# Patient Record
Sex: Female | Born: 1990 | Race: Black or African American | Hispanic: No | Marital: Single | Smoking: Never smoker
Health system: Southern US, Community
[De-identification: ages and names within clinical notes are randomized; demographics above are authoritative.]

## PROBLEM LIST (undated history)

## (undated) ENCOUNTER — Inpatient Hospital Stay (HOSPITAL_COMMUNITY): Payer: Self-pay

## (undated) DIAGNOSIS — R51 Headache: Secondary | ICD-10-CM

## (undated) DIAGNOSIS — R569 Unspecified convulsions: Secondary | ICD-10-CM

## (undated) DIAGNOSIS — R87619 Unspecified abnormal cytological findings in specimens from cervix uteri: Secondary | ICD-10-CM

## (undated) DIAGNOSIS — IMO0002 Reserved for concepts with insufficient information to code with codable children: Secondary | ICD-10-CM

## (undated) DIAGNOSIS — A549 Gonococcal infection, unspecified: Secondary | ICD-10-CM

## (undated) DIAGNOSIS — J45909 Unspecified asthma, uncomplicated: Secondary | ICD-10-CM

## (undated) HISTORY — DX: Gonococcal infection, unspecified: A54.9

## (undated) HISTORY — DX: Unspecified abnormal cytological findings in specimens from cervix uteri: R87.619

## (undated) HISTORY — DX: Unspecified convulsions: R56.9

## (undated) HISTORY — PX: APPENDECTOMY: SHX54

## (undated) HISTORY — DX: Reserved for concepts with insufficient information to code with codable children: IMO0002

## (undated) HISTORY — DX: Headache: R51

---
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
## 2010-10-28 LAB — PREGNANCY, URINE: Preg Test, Ur: POSITIVE

## 2010-10-28 LAB — CBC
HCT: 34.9 % — ABNORMAL LOW (ref 36.0–49.0)
Platelets: 216 10*3/uL (ref 150–400)
WBC: 9.1 10*3/uL (ref 4.5–13.5)

## 2010-10-28 LAB — DIFFERENTIAL
Lymphocytes Relative: 26 % (ref 24–48)
Lymphs Abs: 2.3 10*3/uL (ref 1.1–4.8)
Neutrophils Relative %: 63 % (ref 43–71)

## 2010-10-28 LAB — HCG, QUANTITATIVE, PREGNANCY: hCG, Beta Chain, Quant, S: 131492 m[IU]/mL — ABNORMAL HIGH (ref <5)

## 2010-10-28 LAB — BASIC METABOLIC PANEL
BUN: 4 mg/dL — ABNORMAL LOW (ref 6–23)
Potassium: 3.6 mEq/L (ref 3.5–5.1)

## 2010-10-28 LAB — URINALYSIS, ROUTINE W REFLEX MICROSCOPIC
Glucose, UA: NEGATIVE mg/dL
Protein, ur: NEGATIVE mg/dL
Specific Gravity, Urine: 1.021 (ref 1.005–1.030)
pH: 6 (ref 5.0–8.0)

## 2012-04-25 ENCOUNTER — Emergency Department (HOSPITAL_COMMUNITY)
Admission: EM | Admit: 2012-04-25 | Discharge: 2012-04-26 | Disposition: A | Discharge status: Home / Self Care | Payer: Self-pay | Attending: Emergency Medicine | Admitting: Emergency Medicine

## 2012-04-25 ENCOUNTER — Emergency Department (HOSPITAL_COMMUNITY): Payer: Self-pay

## 2012-04-25 ENCOUNTER — Encounter (HOSPITAL_COMMUNITY): Payer: Self-pay

## 2012-04-25 DIAGNOSIS — Z3490 Encounter for supervision of normal pregnancy, unspecified, unspecified trimester: Secondary | ICD-10-CM

## 2012-04-25 DIAGNOSIS — Z331 Pregnant state, incidental: Secondary | ICD-10-CM | POA: Insufficient documentation

## 2012-04-25 DIAGNOSIS — O200 Threatened abortion: Secondary | ICD-10-CM | POA: Insufficient documentation

## 2012-04-25 DIAGNOSIS — R109 Unspecified abdominal pain: Secondary | ICD-10-CM | POA: Insufficient documentation

## 2012-04-25 DIAGNOSIS — R10819 Abdominal tenderness, unspecified site: Secondary | ICD-10-CM | POA: Insufficient documentation

## 2012-04-25 HISTORY — DX: Unspecified asthma, uncomplicated: J45.909

## 2012-04-25 LAB — COMPREHENSIVE METABOLIC PANEL
ALT: 11 U/L (ref 0–35)
Albumin: 3.9 g/dL (ref 3.5–5.2)
Alkaline Phosphatase: 61 U/L (ref 39–117)
BUN: 9 mg/dL (ref 6–23)
Chloride: 102 mEq/L (ref 96–112)
Glucose, Bld: 80 mg/dL (ref 70–99)
Potassium: 3.6 mEq/L (ref 3.5–5.1)
Sodium: 134 mEq/L — ABNORMAL LOW (ref 135–145)
Total Bilirubin: 0.4 mg/dL (ref 0.3–1.2)

## 2012-04-25 LAB — URINALYSIS, ROUTINE W REFLEX MICROSCOPIC
Leukocytes, UA: NEGATIVE
Nitrite: NEGATIVE
Specific Gravity, Urine: 1.027 (ref 1.005–1.030)
pH: 6 (ref 5.0–8.0)

## 2012-04-25 LAB — CBC WITH DIFFERENTIAL/PLATELET
Basophils Relative: 0 % (ref 0–1)
Hemoglobin: 11.8 g/dL — ABNORMAL LOW (ref 12.0–15.0)
Lymphs Abs: 2.4 10*3/uL (ref 0.7–4.0)
Monocytes Relative: 8 % (ref 3–12)
Neutro Abs: 5.3 10*3/uL (ref 1.7–7.7)
Neutrophils Relative %: 62 % (ref 43–77)
RBC: 3.91 MIL/uL (ref 3.87–5.11)

## 2012-04-25 LAB — POCT PREGNANCY, URINE: Preg Test, Ur: POSITIVE — AB

## 2012-04-25 LAB — WET PREP, GENITAL: Trich, Wet Prep: NONE SEEN

## 2012-04-25 NOTE — ED Notes (Signed)
Pt presents with NAD- reports generalized abdominal pain- denies pain with urination,vaginal discharge and odor

## 2012-04-25 NOTE — ED Provider Notes (Signed)
History     CSN: [id number]  Arrival date & time 04/25/12  1907   First MD Initiated Contact with Patient 04/25/12 2014      Chief Complaint  Patient presents with  . Abdominal Pain    3 days    (Consider location/radiation/quality/duration/timing/severity/associated sxs/prior treatment) HPI Comments: Laurie [name] 21 y.o. female   The chief complaint is: Patient presents with:   Abdominal Pain - 75 days    21 year old female presents to the ED today with chief complaint of lower abdominal pain. States that pain began approximately 2 days ago. Described as intermittent, and crampy, bilateral lower quadrants. Worse on the left side. Last menstrual period was 03/28/2012. He denies, nausea, vomiting, diarrhea, change in bowel habits, fever, chills, myalgias, arthralgias, chest pain, shortness of breath, headaches. Denies, dysuria, or other urinary symptoms. No vaginal discharge or other vaginal complaints.  Patient is a 21 y.o. female presenting with abdominal pain. The history is provided by the patient and medical records. No language interpreter was used.  Abdominal Pain The primary symptoms of the illness include abdominal pain. The primary symptoms of the illness do not include fever, fatigue, shortness of breath, nausea, vomiting, diarrhea, hematemesis, hematochezia, dysuria, vaginal discharge or vaginal bleeding. The current episode started 2 days ago. The onset of the illness was gradual. The problem has been gradually improving.  The patient states that she believes she is currently pregnant. The patient has not had a change in bowel habit. Symptoms associated with the illness do not include chills, anorexia, diaphoresis, heartburn, constipation, urgency, hematuria, frequency or back pain. Significant associated medical issues do not include PUD, inflammatory bowel disease, diabetes, sickle cell disease, liver disease, substance abuse, diverticulitis or cardiac disease.     Past Medical History  Diagnosis Date  . Asthma     History reviewed. No pertinent past surgical history.  No family history on file.  History  Substance Use Topics  . Smoking status: Never Smoker   . Smokeless tobacco: Not on file  . Alcohol Use: No    OB History    Grav Para Term Preterm Abortions TAB SAB Ect Mult Living                  Review of Systems  Constitutional: Negative for fever, chills, diaphoresis and fatigue.  HENT: Negative for trouble swallowing.   Eyes: Negative for visual disturbance.  Respiratory: Negative for shortness of breath.   Cardiovascular: Negative for chest pain and palpitations.  Gastrointestinal: Positive for abdominal pain. Negative for heartburn, nausea, vomiting, diarrhea, constipation, hematochezia, anorexia and hematemesis.  Genitourinary: Negative for dysuria, urgency, frequency, hematuria, vaginal bleeding, vaginal discharge and vaginal pain.  Musculoskeletal: Negative for myalgias, back pain and arthralgias.  Skin: Negative for rash.  Neurological: Negative for weakness, light-headedness and headaches.  Psychiatric/Behavioral: Negative.   All other systems reviewed and are negative.    Allergies  Review of patient's allergies indicates no known allergies.  Home Medications  No current outpatient prescriptions on file.  BP 117/54  Pulse 73  Temp 98.4 F (36.9 C) (Oral)  Resp 18  SpO2 96%  LMP 03/28/2012  Physical Exam  Nursing note and vitals reviewed. Constitutional: She is oriented to person, place, and time. She appears well-developed and well-nourished. No distress.  HENT:  Head: Normocephalic and atraumatic.  Eyes: Conjunctivae normal are normal. No scleral icterus.  Neck: Normal range of motion.  Cardiovascular: Normal rate, regular rhythm and normal heart sounds.  Exam reveals no gallop  and no friction rub.   No murmur heard. Pulmonary/Chest: Effort normal and breath sounds normal. No respiratory  distress.  Abdominal: Soft. Bowel sounds are normal. She exhibits no distension and no mass. There is Tenderness: mild tenderness to  deep palpation of bilateral lower abdominal quadrant.. There is no guarding.  Neurological: She is alert and oriented to person, place, and time.  Skin: Skin is warm and dry. She is not diaphoretic.  Psychiatric: She has a normal mood and affect. Her behavior is normal. Judgment and thought content normal.    ED Course  Procedures (including critical care time) Results for orders placed during the hospital encounter of 04/25/12  URINALYSIS, ROUTINE W REFLEX MICROSCOPIC      Component Value Range   Color, Urine YELLOW  YELLOW   APPearance CLEAR  CLEAR   Specific Gravity, Urine 1.027  1.005 - 1.030   pH 6.0  5.0 - 8.0   Glucose, UA NEGATIVE  NEGATIVE mg/dL   Hgb urine dipstick NEGATIVE  NEGATIVE   Bilirubin Urine NEGATIVE  NEGATIVE   Ketones, ur 15 (*) NEGATIVE mg/dL   Protein, ur NEGATIVE  NEGATIVE mg/dL   Urobilinogen, UA 2.0 (*) 0.0 - 1.0 mg/dL   Nitrite NEGATIVE  NEGATIVE   Leukocytes, UA NEGATIVE  NEGATIVE  CBC WITH DIFFERENTIAL      Component Value Range   WBC 8.5  4.0 - 10.5 K/uL   RBC 3.91  3.87 - 5.11 MIL/uL   Hemoglobin 11.8 (*) 12.0 - 15.0 g/dL   HCT 47.8 (*) 29.5 - 62.1 %   MCV 90.8  78.0 - 100.0 fL   MCH 30.2  26.0 - 34.0 pg   MCHC 33.2  30.0 - 36.0 g/dL   RDW 30.8  65.7 - 84.6 %   Platelets 281  150 - 400 K/uL   Neutrophils Relative 62  43 - 77 %   Neutro Abs 5.3  1.7 - 7.7 K/uL   Lymphocytes Relative 29  12 - 46 %   Lymphs Abs 2.4  0.7 - 4.0 K/uL   Monocytes Relative 8  3 - 12 %   Monocytes Absolute 0.7  0.1 - 1.0 K/uL   Eosinophils Relative 1  0 - 5 %   Eosinophils Absolute 0.1  0.0 - 0.7 K/uL   Basophils Relative 0  0 - 1 %   Basophils Absolute 0.0  0.0 - 0.1 K/uL  COMPREHENSIVE METABOLIC PANEL      Component Value Range   Sodium 134 (*) 135 - 145 mEq/L   Potassium 3.6  3.5 - 5.1 mEq/L   Chloride 102  96 - 112 mEq/L    CO2 24  19 - 32 mEq/L   Glucose, Bld 80  70 - 99 mg/dL   BUN 9  6 - 23 mg/dL   Creatinine, Ser 9.62  0.50 - 1.10 mg/dL   Calcium 9.2  8.4 - 95.2 mg/dL   Total Protein 7.3  6.0 - 8.3 g/dL   Albumin 3.9  3.5 - 5.2 g/dL   AST 18  0 - 37 U/L   ALT 11  0 - 35 U/L   Alkaline Phosphatase 61  39 - 117 U/L   Total Bilirubin 0.4  0.3 - 1.2 mg/dL   GFR calc non Af Amer >90  >90 mL/min   GFR calc Af Amer >90  >90 mL/min  POCT PREGNANCY, URINE      Component Value Range   Preg Test, Ur POSITIVE (*) NEGATIVE  GC/CHLAMYDIA PROBE AMP, GENITAL      Component Value Range   GC Probe Amp, Genital NEGATIVE  NEGATIVE   Chlamydia, DNA Probe NEGATIVE  NEGATIVE  WET PREP, GENITAL      Component Value Range   Yeast Wet Prep HPF POC NONE SEEN  NONE SEEN   Trich, Wet Prep NONE SEEN  NONE SEEN   Clue Cells Wet Prep HPF POC RARE (*) NONE SEEN   WBC, Wet Prep HPF POC RARE (*) NONE SEEN  ABO/RH      Component Value Range   ABO/RH(D) O POS       US Ob Comp Less 14 Wks  04/25/2012  *RADIOLOGY REPORT*  Clinical Data: Pregnant.  Pelvic pain.  No bleeding.  OBSTETRIC <14 WK [place] AND TRANSVAGINAL OB US  Technique:  Both transabdominal and transvaginal ultrasound examinations were performed for complete evaluation of the gestation as well as the maternal uterus, adnexal regions, and pelvic cul-de-sac.  Transvaginal technique was performed to assess early pregnancy.  Comparison:  07/18/2008  Intrauterine gestational sac:  Visualized/normal in shape. Yolk sac: Visualized Embryo: Not visualized Cardiac Activity: Not visualized Heart Rate:  N/A bpm  MSD: 13.4 mm  six w one dUS EDC: 12/18/2012  Maternal uterus/adnexae: Uterus is normal in size and echogenicity with no masses.  There are no adnexal masses.  Both ovaries are normal in size and echogenicity with normal blood flow by color flow analysis.  No adnexal masses.  No pelvic free fluid.  IMPRESSION: Well-formed gestational sac within the uterus containing a yolk sac, but  no embryo. This may simply reflect an early intrauterine pregnancy, but will need follow up imaging to confirm this.  Uterus is otherwise unremarkable.  Normal ovaries and adnexa.  Recommend follow-up OB ultrasound in 7-10 days to document normal pregnancy progression.   Original Report Authenticated By: Domenic Moras, M.D.      1. Pregnancy   2. Threatened miscarriage in early pregnancy   3. Abdominal pain       MDM  Patient with positive urine pregnancy test. Abdominal pain, but She denies any bleeding, or fluid in the vaginal vault. She is G2P1001. Patient was not using any forms of birth control.  Pelvic exam: VULVA: normal appearing vulva with no masses, tenderness or lesions, VAGINA: normal appearing vagina with normal color and discharge, no lesions, CERVIX: ectropion preset, multiparous os, Nabothian cyst at 10 and 11 o'clock, UTERUS: uterus is normal size, shape, consistency and nontender, enlarged and just palpable above the pelvic ramus, ADNEXA: normal adnexa in size, nontender and no masses, no masses, WET MOUNT done - results: clue cells, white blood cells.  Patient with present gestational and yolk sac.  She will need repeat ultrasound in  7 days.  I have given the patien tfolow up at  [facility] outpatient clinic.  Labs shoe O pos blood type. No need for rhogam, rare clue cells and no vaginal sxs Discussed reasons to seek immediate care. Patient expresses understanding and agrees with plan.          Arthor Captain, PA-C 04/26/12 2152

## 2012-04-25 NOTE — ED Notes (Signed)
US at bedside

## 2012-04-25 NOTE — ED Notes (Signed)
Pt thinks she might be pregnant- pt is sexually active with no form of birth control

## 2012-04-26 ENCOUNTER — Encounter (HOSPITAL_COMMUNITY): Payer: Self-pay

## 2012-04-26 LAB — ABO/RH: ABO/RH(D): O POS

## 2012-04-26 NOTE — ED Provider Notes (Signed)
Medical screening examination/treatment/procedure(s) were performed by non-physician practitioner and as supervising physician I was immediately available for consultation/collaboration.  Hurman Horn, MD 04/26/12 2322

## 2012-05-25 ENCOUNTER — Inpatient Hospital Stay (HOSPITAL_COMMUNITY)
Admission: AD | Admit: 2012-05-25 | Discharge: 2012-05-25 | Disposition: A | Discharge status: Home / Self Care | Payer: Medicaid Other | Source: Ambulatory Visit | Attending: Obstetrics & Gynecology | Admitting: Obstetrics & Gynecology

## 2012-05-25 ENCOUNTER — Encounter (HOSPITAL_COMMUNITY): Payer: Self-pay | Admitting: *Deleted

## 2012-05-25 DIAGNOSIS — M549 Dorsalgia, unspecified: Secondary | ICD-10-CM | POA: Insufficient documentation

## 2012-05-25 DIAGNOSIS — O26899 Other specified pregnancy related conditions, unspecified trimester: Secondary | ICD-10-CM

## 2012-05-25 DIAGNOSIS — N949 Unspecified condition associated with female genital organs and menstrual cycle: Secondary | ICD-10-CM

## 2012-05-25 DIAGNOSIS — O99891 Other specified diseases and conditions complicating pregnancy: Secondary | ICD-10-CM | POA: Insufficient documentation

## 2012-05-25 LAB — URINALYSIS, ROUTINE W REFLEX MICROSCOPIC
Glucose, UA: NEGATIVE mg/dL
Ketones, ur: NEGATIVE mg/dL
Leukocytes, UA: NEGATIVE
Nitrite: NEGATIVE
pH: 6 (ref 5.0–8.0)

## 2012-05-25 NOTE — MAU Note (Signed)
Pt states yesterday had bad cramp when she ate, and has continued to have low back pain. Stomach pain is intermittent, has acid reflux/indigestion. Denies uti s/s. Does note some abd pain post voiding at times. Denies abnormal discharge or bleeding.

## 2012-05-25 NOTE — MAU Note (Signed)
Pt states she has had lower back pain and abdominal pain for about 3 wks.

## 2012-05-25 NOTE — MAU Provider Note (Signed)
  History     CSN: [id number]  Arrival date and time: 05/25/12 1304   First Provider Initiated Contact with Patient 05/25/12 1629      Chief Complaint  Patient presents with  . Back Pain  . Light headed    HPI Pt is a G3P1102 here at 10.3 wks IUP with report of lower pelvic pain that started 4 weeks ago.  Pain is described as "crampy" with no report of vaginal bleeding.  Pain radiates to lower back.  No report of UTI or abnormal vaginal discharge symptoms.    Past Medical History  Diagnosis Date  . Asthma     Past Surgical History  Procedure Date  . No past surgeries     Family History  Problem Relation Age of Onset  . Hypertension Father     History  Substance Use Topics  . Smoking status: Never Smoker   . Smokeless tobacco: Not on file  . Alcohol Use: No    Allergies: No Known Allergies  No prescriptions prior to admission    Review of Systems  Gastrointestinal: Positive for abdominal pain (cramping).  Musculoskeletal: Positive for back pain.  Neurological: Positive for dizziness.  All other systems reviewed and are negative.   Physical Exam   Blood pressure 118/63, pulse 71, temperature 97.5 F (36.4 C), temperature source Oral, resp. rate 16, height 5\' 5"  (1.651 m), weight 52.39 kg (115 lb 8 oz), last menstrual period 03/28/2012, SpO2 100.00%, unknown if currently breastfeeding.  Physical Exam  Constitutional: She is oriented to person, place, and time. She appears well-developed and well-nourished. No distress.  HENT:  Head: Normocephalic.  Neck: Normal range of motion. Neck supple.  Cardiovascular: Normal rate, regular rhythm and normal heart sounds.   Respiratory: Effort normal and breath sounds normal. No respiratory distress.  GI: Soft. There is no tenderness. There is no CVA tenderness.       Fundal height 11cm   Genitourinary: Uterus is enlarged. Cervix exhibits no motion tenderness and no discharge.  Musculoskeletal: Normal range of  motion.  Neurological: She is alert and oriented to person, place, and time.  Skin: Skin is warm and dry.  Psychiatric: She has a normal mood and affect.    MAU Course  Procedures  Results for orders placed during the hospital encounter of 05/25/12 (from the past 24 hour(s))  URINALYSIS, ROUTINE W REFLEX MICROSCOPIC     Status: Abnormal   Collection Time   05/25/12  1:47 PM      Component Value Range   Color, Urine YELLOW  YELLOW   APPearance CLEAR  CLEAR   Specific Gravity, Urine >1.030 (*) 1.005 - 1.030   pH 6.0  5.0 - 8.0   Glucose, UA NEGATIVE  NEGATIVE mg/dL   Hgb urine dipstick NEGATIVE  NEGATIVE   Bilirubin Urine NEGATIVE  NEGATIVE   Ketones, ur NEGATIVE  NEGATIVE mg/dL   Protein, ur NEGATIVE  NEGATIVE mg/dL   Urobilinogen, UA 1.0  0.0 - 1.0 mg/dL   Nitrite NEGATIVE  NEGATIVE   Leukocytes, UA NEGATIVE  NEGATIVE   Bedside ultrasound:  +IUP, +FHR seen 140's  Assessment and Plan  Pelvic Pain in Early Pregnancy  Plan: DC to home Begin prenatal vitamins. Schedule appt with [facility] as planned  [facility] 05/25/2012, 4:32 PM

## 2012-05-25 NOTE — MAU Provider Note (Signed)
Attestation of Attending Supervision of Advanced Practitioner (CNM/NP): Evaluation and management procedures were performed by the Advanced Practitioner under my supervision and collaboration.  I have reviewed the Advanced Practitioner's note and chart, and I agree with the management and plan.  Erricka Falkner, MD, FACOG Attending Obstetrician & Gynecologist Faculty Practice, [facility]  

## 2012-06-07 ENCOUNTER — Inpatient Hospital Stay (HOSPITAL_COMMUNITY)
Admission: AD | Admit: 2012-06-07 | Discharge: 2012-06-07 | Disposition: A | Discharge status: Home / Self Care | Payer: Medicaid Other | Source: Ambulatory Visit | Attending: Obstetrics & Gynecology | Admitting: Obstetrics & Gynecology

## 2012-06-07 ENCOUNTER — Encounter (HOSPITAL_COMMUNITY): Payer: Self-pay | Admitting: *Deleted

## 2012-06-07 DIAGNOSIS — Z3490 Encounter for supervision of normal pregnancy, unspecified, unspecified trimester: Secondary | ICD-10-CM

## 2012-06-07 DIAGNOSIS — K5900 Constipation, unspecified: Secondary | ICD-10-CM | POA: Insufficient documentation

## 2012-06-07 DIAGNOSIS — O99891 Other specified diseases and conditions complicating pregnancy: Secondary | ICD-10-CM | POA: Insufficient documentation

## 2012-06-07 DIAGNOSIS — R109 Unspecified abdominal pain: Secondary | ICD-10-CM | POA: Insufficient documentation

## 2012-06-07 LAB — URINALYSIS, ROUTINE W REFLEX MICROSCOPIC
Bilirubin Urine: NEGATIVE
Ketones, ur: NEGATIVE mg/dL
Leukocytes, UA: NEGATIVE
Nitrite: NEGATIVE
Specific Gravity, Urine: 1.015 (ref 1.005–1.030)
Urobilinogen, UA: 0.2 mg/dL (ref 0.0–1.0)

## 2012-06-07 MED ORDER — FLEET ENEMA 7-19 GM/118ML RE ENEM
1.0000 | ENEMA | Freq: Once | RECTAL | Status: AC
  Administered 2012-06-07: 1 via RECTAL

## 2012-06-07 NOTE — MAU Note (Signed)
PT HAS BEEN TO B-ROOM   WITH GOOD RESULTS-  AND SAYS SHE FEELS   SO MUCH BETTER.

## 2012-06-07 NOTE — MAU Note (Signed)
PT HAS ARRIVED VIA EMS.  COLLECTED URINE.     PT SAYS SHE HAS  BEEN HAVING  LOWER ABD SINCE-  STARTED LAST Sunday.  LAST SEX  WAS YESTERDAY.    HAS HAPPENED  BEFORE- 2.5 MTHS AGO- IN September- WENT TO  [place]- DID  UPT.    HAS NOT STARTED PNC.    HAD OTHER  2 BABIES IN [place].      HAS NOT TAKEN ANY MED FOR PAIN.

## 2012-06-07 NOTE — MAU Provider Note (Signed)
History     CSN: [id number]  Arrival date and time: 06/07/12 5638   First Provider Initiated Contact with Patient 06/07/12 0037      No chief complaint on file.  HPI Laurie [name] is a 21 y.o. female @ [redacted]w[redacted]d gestation who presents to MAU via EMS with abdominal pain. The pain started last week. She describes the pain as sharp. The pain is located in the lower abdomen and radiates to the left upper abdomen. Pain keeps her from sleeping sometimes. She was evaluated for the pregnancy at [facility] in the ED 10/13 and had ultrasound, pelvic exam and cultures. The cultures were negative and the ultrasound showed an IUP. She came to MAU with abdominal pain 05/25/12 and had exam and bedside ultrasound. She returns tonight with the same pain. She has not taken anything for pain. The history was provided by the patient.  OB History    Grav Para Term Preterm Abortions TAB SAB Ect Mult Living   3 2 1 1  0 0 0 0 0 2      Past Medical History  Diagnosis Date  . Asthma     Past Surgical History  Procedure Date  . No past surgeries     Family History  Problem Relation Age of Onset  . Hypertension Father     History  Substance Use Topics  . Smoking status: Former Games developer  . Smokeless tobacco: Not on file  . Alcohol Use: No    Allergies: No Known Allergies  No prescriptions prior to admission    Review of Systems  Constitutional: Negative for fever, chills and weight loss.  HENT: Negative for ear pain, nosebleeds, congestion, sore throat and neck pain.   Eyes: Negative for blurred vision, double vision, photophobia and pain.  Respiratory: Negative for cough, shortness of breath and wheezing.   Cardiovascular: Negative for chest pain, palpitations and leg swelling.  Gastrointestinal: Positive for heartburn and abdominal pain. Negative for nausea, vomiting, diarrhea and constipation.  Genitourinary: Positive for frequency. Negative for dysuria and urgency.  Musculoskeletal:  Positive for back pain. Negative for myalgias.  Skin: Negative for itching and rash.  Neurological: Positive for headaches. Negative for dizziness, sensory change, speech change, seizures and weakness.  Endo/Heme/Allergies: Does not bruise/bleed easily.  Psychiatric/Behavioral: Negative for depression. The patient is not nervous/anxious.    Physical Exam   Blood pressure 100/54, pulse 79, temperature 98.1 F (36.7 C), temperature source Oral, resp. rate 20, height 5\' 6"  (1.676 m), weight 117 lb 6 oz (53.241 kg), last menstrual period 03/28/2012, unknown if currently breastfeeding.  Physical Exam  Nursing note and vitals reviewed. Constitutional: She is oriented to person, place, and time. She appears well-developed and well-nourished. No distress.  HENT:  Head: Normocephalic and atraumatic.  Eyes: EOM are normal.  Neck: Neck supple.  Cardiovascular: Normal rate.   Respiratory: Effort normal.  GI: Soft. There is no tenderness.  Genitourinary:       External genitalia without lesions. White discharge vaginal vault. Cervix long, closed, no CMT, no adnexal tenderness. Uterus consistent with dates.  Rectal exam good tone, hard stool palpated.   Musculoskeletal: Normal range of motion.  Neurological: She is alert and oriented to person, place, and time.  Skin: Skin is warm and dry.  Psychiatric: She has a normal mood and affect. Her behavior is normal. Judgment and thought content normal.   Assessment: 21 y.o. female @ [redacted]w[redacted]d with constipation   Plan:  Fleet enema   Discussed with patient  diet and constipation MAU Course  Procedures Patient states her pain is completely gone after the enema. Instructions regarding prenatal care and diet. Discussed with the patient and all questioned fully answered. She will return if any problems arise.  Lem Peary, RN, FNP, [facility] 06/07/2012, 1:40 AM

## 2012-07-14 NOTE — L&D Delivery Note (Signed)
Delivery Note Labor progressed w/o further intervention following AROM.  Complete and uncomfortable w/ urge to push at 0250. At 3:02 AM a viable female "[name]" was delivered via Vaginal, Spontaneous Delivery (Presentation: Middle Occiput Anterior).  APGAR: 9, 9; weight 6 lb 15.5 oz (3160 g).  Newborn w/ spontaneous cry before body had even fully delivered.  Cord doubly clamped and cut by FOB.  Placenta status: Intact, Spontaneous, Schultz.  Cord: 3 vessels with the following complications: None.  Cord pH: not  Collected.  Anesthesia: Epidural  Episiotomy: None Lacerations: None Suture Repair: n/a Est. Blood Loss (mL): 300  Mom to postpartum.  Baby to nursery-stable. Pt, FOB, and family all bonding well w/ newborn.  Planning outpatient circumcision.  Cleotha Tsang H 12/16/2012, 4:18 AM

## 2012-07-16 ENCOUNTER — Ambulatory Visit (INDEPENDENT_AMBULATORY_CARE_PROVIDER_SITE_OTHER): Payer: Medicaid Other | Admitting: Obstetrics and Gynecology

## 2012-07-16 DIAGNOSIS — J45909 Unspecified asthma, uncomplicated: Secondary | ICD-10-CM

## 2012-07-16 DIAGNOSIS — Z3490 Encounter for supervision of normal pregnancy, unspecified, unspecified trimester: Secondary | ICD-10-CM

## 2012-07-16 DIAGNOSIS — Z331 Pregnant state, incidental: Secondary | ICD-10-CM

## 2012-07-16 DIAGNOSIS — Z3689 Encounter for other specified antenatal screening: Secondary | ICD-10-CM

## 2012-07-16 LAB — POCT URINALYSIS DIPSTICK
Bilirubin, UA: NEGATIVE
Blood, UA: NEGATIVE
Glucose, UA: NEGATIVE
Ketones, UA: NEGATIVE
Nitrite, UA: NEGATIVE
Spec Grav, UA: 1.015

## 2012-07-16 MED ORDER — ALBUTEROL SULFATE HFA 108 (90 BASE) MCG/ACT IN AERS: 2.0000 | INHALATION_SPRAY | Freq: Four times a day (QID) | RESPIRATORY_TRACT | Status: DC | PRN

## 2012-07-16 NOTE — Progress Notes (Signed)
NOB interview.  Pt states she fell down steps and landed on her back 07/11/12. Did not have eval.  States previous to fall felt+FM but none since. No bleeding. Some cramping at night.  Feels as though abd has decreased in size. For eval today with VL.   Per VL, anatomy U/S scheduled 07/19/12 and F/U with TH. Pt desires QUAD screen.  To be done at [facility] if dating correct per VL. Pt requesting inhaler to be used prn for asthma. Rx per VL. Advised pt to F/U with PCP.

## 2012-07-16 NOTE — Progress Notes (Signed)
FHT done due to recent fall and pt concerns re less perceptions of FM. FHT 150, Fundus at U. Anticipate anatomy scan in 1 to 2 weeks. NOB workup 1/14

## 2012-07-17 LAB — CULTURE, OB URINE

## 2012-07-19 ENCOUNTER — Ambulatory Visit (INDEPENDENT_AMBULATORY_CARE_PROVIDER_SITE_OTHER): Payer: Medicaid Other

## 2012-07-19 ENCOUNTER — Ambulatory Visit (INDEPENDENT_AMBULATORY_CARE_PROVIDER_SITE_OTHER): Payer: Medicaid Other | Admitting: *Deleted

## 2012-07-19 VITALS — BP 92/58 | Wt 122.0 lb

## 2012-07-19 DIAGNOSIS — O289 Unspecified abnormal findings on antenatal screening of mother: Secondary | ICD-10-CM

## 2012-07-19 DIAGNOSIS — Z3689 Encounter for other specified antenatal screening: Secondary | ICD-10-CM

## 2012-07-19 DIAGNOSIS — Z331 Pregnant state, incidental: Secondary | ICD-10-CM

## 2012-07-19 DIAGNOSIS — O358XX0 Maternal care for other (suspected) fetal abnormality and damage, not applicable or unspecified: Secondary | ICD-10-CM

## 2012-07-19 LAB — PRENATAL PANEL VII
Basophils Absolute: 0 10*3/uL (ref 0.0–0.1)
Basophils Relative: 0 % (ref 0–1)
Eosinophils Absolute: 0.1 10*3/uL (ref 0.0–0.7)
Hepatitis B Surface Ag: NEGATIVE
MCH: 31.1 pg (ref 26.0–34.0)
MCHC: 33 g/dL (ref 30.0–36.0)
Neutro Abs: 5.6 10*3/uL (ref 1.7–7.7)
Neutrophils Relative %: 68 % (ref 43–77)
Platelets: 236 10*3/uL (ref 150–400)
RBC: 3.86 MIL/uL — ABNORMAL LOW (ref 3.87–5.11)

## 2012-07-19 LAB — US OB COMP + 14 WK

## 2012-07-19 NOTE — Progress Notes (Signed)
Fundal hght U-2  Doing well with no c/o Reviewed [place] results - bilat pyelectasis noted will f/up with [place] at approx 28wks Has NOB appt on 1/13 Quad screen done today.

## 2012-07-19 NOTE — Progress Notes (Signed)
[redacted]w[redacted]d  AFP Quad today.  Pt states when she got up this morning she was light-headed.   Ultrasound shows:  Gestational age by [place] : 18w 0d     SIUP  S=D     [place] EDD: 12/20/2012             EFW: 221g (8oz)  30.3% tile            Cervical length: 3.09 cm            Placenta localization: anterior           Fetal presentation: breech       Anatomy survey is see comments    Anatomy complete?yes            Gender : female        Comments: Placental edge is 4.8 cm from internal os-normal fluids normal     (Vertical pocket = 4.8cm)    No Anomalies seen (profile, palate, philtrum, nasal bone, open hands, 5th digit, feet heel seen,    Female gender, ductal arch is unseen due to fetal position.    Bilateral Pyelectasis (left kidney = 0.37 cm , right kidney = 0.47cm)    Hypocoiled  Umbilical cord is noted. Suggest f/u in 4 weeks.    Normal ovaries, No fluid in CDS , normal adenexas

## 2012-07-20 LAB — HEMOGLOBINOPATHY EVALUATION
Hemoglobin Other: 0 %
Hgb A2 Quant: 2.8 % (ref 2.2–3.2)
Hgb A: 97.2 % (ref 96.8–97.8)
Hgb F Quant: 0 % (ref 0.0–2.0)
Hgb S Quant: 0 %

## 2012-07-20 LAB — AFP, QUAD SCREEN
Curr Gest Age: 18 wks.days
INH: 58 pg/mL
MoM for INH: 0.3
Osb Risk: 1:54400 {titer}
Trisomy 18 (Edward) Syndrome Interp.: 1:9900 {titer}
uE3 Value: 0.9 ng/mL

## 2012-07-26 ENCOUNTER — Ambulatory Visit: Payer: Medicaid Other | Admitting: *Deleted

## 2012-07-26 VITALS — BP 98/52 | Wt 123.0 lb

## 2012-07-26 DIAGNOSIS — O09219 Supervision of pregnancy with history of pre-term labor, unspecified trimester: Secondary | ICD-10-CM

## 2012-07-26 DIAGNOSIS — Z331 Pregnant state, incidental: Secondary | ICD-10-CM

## 2012-07-26 LAB — POCT WET PREP (WET MOUNT)
Clue Cells Wet Prep Whiff POC: NEGATIVE
pH: 4.5

## 2012-07-26 MED ORDER — PRENATAL MULTIVITAMIN CH: 1.0000 | ORAL_TABLET | Freq: Every day | ORAL | Status: DC

## 2012-07-26 NOTE — Progress Notes (Unsigned)
Pt is here today for her NOB work-up . Pt stated she had a pap 2012 ?Marland Kitchen Pt stated no other issues today.

## 2012-07-26 NOTE — Progress Notes (Unsigned)
Subjective:    Laurie [name] is being seen today for her first obstetrical visit at [redacted]w[redacted]d gestation by U/S She is 22 y.o. African American. The father is involved in the pregnancy.  She does not work outside the home.  She denies any vag bleeding, cramping, or discharge.  She denies nausea/vomiting.   Her obstetrical history is significant for: Patient Active Problem List  Diagnosis  . Asthma  . Pyelectasis of fetus on prenatal ultrasound      Pregnancy history fully reviewed.  The following portions of the patient's history were reviewed and updated as appropriate: allergies, current medications, past family history, past medical history, past social history, past surgical history and problem list.  Review of Systems Pertinent ROS is described in HPI   Objective:   BP 98/52  Wt 123 lb (55.792 kg)  LMP 03/28/2012  Breastfeeding? Unknown Wt Readings from Last 1 Encounters:  07/26/12 123 lb (55.792 kg)   BMI: 18.6 General: alert, cooperative and no distress HEENT: grossly normal  Thyroid: normal  Respiratory: clear to auscultation bilaterally Cardiovascular: regular rate and rhythm,  Breasts:  No dominant masses, nipples erect Gastrointestinal: soft, non-tender; no masses,  no organomegaly Extremities: extremities normal, no pain or edema   EXTERNAL GENITALIA: normal appearing vulva with no masses, tenderness or lesions VAGINA: no abnormal discharge, bleeding or lesions CERVIX: no lesions or cervical motion tenderness; cervix closed, long, firm UTERUS: gravid and consistent with 19 weeks ADNEXA: no masses palpable and nontender OB EXAM PELVIMETRY: appears adequate  FHR:  140  bpm    Assessment:    Pregnancy at   Plan:     Prenatal labs rv'd  Urine cx: WNL Blood type O+  Pap smear collected:  yes GC/Chlamydia collected:  yes Wet prep:  neg x3  Discussion of Genetic testing options:Discussed Problem list reviewed and updated. rv'd how and  when to call for emergencies rv'd practice routines Discussed nutrition and exercise and common pregnancy discomforts   F/u 4 Weeks   Alexsa Flaum RN/ VL

## 2012-07-28 LAB — PAP IG, CT-NG, RFX HPV ASCU

## 2012-08-02 NOTE — Progress Notes (Signed)
Quick Note:  Please send ASCUS letter. Pap in 1 year. ______ 

## 2012-08-03 ENCOUNTER — Encounter: Payer: Self-pay | Admitting: Obstetrics and Gynecology

## 2012-08-10 ENCOUNTER — Telehealth: Payer: Self-pay | Admitting: *Deleted

## 2012-08-10 NOTE — Telephone Encounter (Signed)
Tc to pt per telephone call. Informed pt to try Tums. If no improvement, pt to try otc Zantac, Pepcid, or Prilosec. Told to cb if no improvement. Pt aware PNV's was called to [facility] ([address]). Pt agrees.

## 2012-08-12 ENCOUNTER — Telehealth: Payer: Self-pay | Admitting: *Deleted

## 2012-08-12 DIAGNOSIS — Z331 Pregnant state, incidental: Secondary | ICD-10-CM

## 2012-08-12 MED ORDER — PRENATAL MULTIVITAMIN CH: 1.0000 | ORAL_TABLET | Freq: Every day | ORAL | Status: DC

## 2012-08-12 NOTE — Telephone Encounter (Signed)
Lm on vm rx sent to pharm 

## 2012-08-26 ENCOUNTER — Encounter: Payer: Medicaid Other | Admitting: Obstetrics and Gynecology

## 2012-09-03 ENCOUNTER — Encounter: Payer: Medicaid Other | Admitting: Family Medicine

## 2012-09-05 ENCOUNTER — Encounter (HOSPITAL_COMMUNITY): Payer: Self-pay | Admitting: Obstetrics and Gynecology

## 2012-09-05 ENCOUNTER — Inpatient Hospital Stay (HOSPITAL_COMMUNITY)
Admission: AD | Admit: 2012-09-05 | Discharge: 2012-09-05 | Disposition: A | Discharge status: Home / Self Care | Payer: Medicaid Other | Source: Ambulatory Visit | Attending: Obstetrics and Gynecology | Admitting: Obstetrics and Gynecology

## 2012-09-05 DIAGNOSIS — N949 Unspecified condition associated with female genital organs and menstrual cycle: Secondary | ICD-10-CM | POA: Insufficient documentation

## 2012-09-05 DIAGNOSIS — N760 Acute vaginitis: Secondary | ICD-10-CM | POA: Insufficient documentation

## 2012-09-05 DIAGNOSIS — B9689 Other specified bacterial agents as the cause of diseases classified elsewhere: Secondary | ICD-10-CM | POA: Insufficient documentation

## 2012-09-05 DIAGNOSIS — O2390 Unspecified genitourinary tract infection in pregnancy, unspecified trimester: Secondary | ICD-10-CM | POA: Insufficient documentation

## 2012-09-05 DIAGNOSIS — R109 Unspecified abdominal pain: Secondary | ICD-10-CM | POA: Insufficient documentation

## 2012-09-05 DIAGNOSIS — M545 Low back pain, unspecified: Secondary | ICD-10-CM | POA: Insufficient documentation

## 2012-09-05 LAB — WET PREP, GENITAL: Trich, Wet Prep: NONE SEEN

## 2012-09-05 LAB — URINALYSIS, ROUTINE W REFLEX MICROSCOPIC
Glucose, UA: NEGATIVE mg/dL
Hgb urine dipstick: NEGATIVE
Specific Gravity, Urine: 1.02 (ref 1.005–1.030)
pH: 7 (ref 5.0–8.0)

## 2012-09-05 LAB — URINE MICROSCOPIC-ADD ON

## 2012-09-05 MED ORDER — METRONIDAZOLE 500 MG PO TABS: 500.0000 mg | ORAL_TABLET | Freq: Two times a day (BID) | ORAL | Status: DC

## 2012-09-05 MED ORDER — IBUPROFEN 600 MG PO TABS: 600.0000 mg | ORAL_TABLET | Freq: Four times a day (QID) | ORAL | Status: DC | PRN

## 2012-09-05 MED ORDER — IBUPROFEN 800 MG PO TABS
800.0000 mg | ORAL_TABLET | Freq: Once | ORAL | Status: AC
  Administered 2012-09-05: 800 mg via ORAL
  Filled 2012-09-05: qty 1

## 2012-09-05 NOTE — MAU Provider Note (Signed)
History   Marizol Borror is a 22y.o. BF at [redacted]w[redacted]d who presents unannounced w/ CC of pelvic and low pain pain since awaking this AM.  Denies VB or LOF or abnl d/c.  Normal FM.  IC around 2100 last night.  Hasn't tried anything for pain.  Denies fever or chills.  No resp or GI c/o's.  No UTI s/s.  Denies abdominal tightening or contractions.   .. Patient Active Problem List  Diagnosis  . Asthma  . Pyelectasis of fetus on prenatal ultrasound  . Pregnancy with history of pre-term labor    CSN: [id number]  Arrival date and time: 09/05/12 1018   First Provider Initiated Contact with Patient 09/05/12 1051      Chief Complaint  Patient presents with  . Pelvic Pain   Pelvic Pain The patient's primary symptoms include pelvic pain.    OB History   Grav Para Term Preterm Abortions TAB SAB Ect Mult Living   3 2 1 1  0 0 0 0 0 2      Past Medical History  Diagnosis Date  . Abnormal Pap smear     LAST PAP 09/2010  . Preterm labor 2011  . Anemia     CHILDHOOD  . Headache     FREQUENT  . Asthma 2010  . 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 No past surgeries      Family History  Problem Relation Age of Onset  . Hypertension Father   . Asthma Father   . Asthma Brother   . Arthritis Paternal Aunt     History  Substance Use Topics  . Smoking status: Passive Smoke Exposure - Never Smoker  . Smokeless tobacco: Never Used  . Alcohol Use: Yes     Comment: OCC    Allergies: No Known Allergies  Prescriptions prior to admission  Medication Sig Dispense Refill  . albuterol (PROVENTIL HFA;VENTOLIN HFA) 108 (90 BASE) MCG/ACT inhaler Inhale 2 puffs into the lungs every 6 (six) hours as needed for wheezing.  1 Inhaler  2  . Prenatal Vit-Fe Fumarate-FA (PRENATAL MULTIVITAMIN) TABS Take 1 tablet by mouth daily.  90 tablet  3    Review of Systems  Genitourinary: Positive for pelvic pain.  -see HPI above Physical Exam    Blood pressure 103/55, pulse 83, temperature 97.9 F (36.6 C), temperature source Oral, resp. rate 16, height 5\' 5"  (1.651 m), weight 134 lb 6.4 oz (60.963 kg), last menstrual period 03/28/2012, unknown if currently breastfeeding.  Physical Exam  Constitutional: She is oriented to person, place, and time. She appears well-developed and well-nourished. No distress.  HENT:  Head: Normocephalic and atraumatic.  Eyes: Pupils are equal, round, and reactive to light.  Cardiovascular: Normal rate.   Respiratory: Effort normal.  GI: Soft.  gravid  Genitourinary:  SSE: pos whiff; small amt of white d/c in vault; lightly coating cul-de-sac. cx closed/long/high/ multiparous in nature; no lesions or blood.   Musculoskeletal: She exhibits no edema.  Neurological: She is alert and oriented to person, place, and time.  Skin: Skin is warm and dry.  . Results for orders placed during the hospital encounter of 09/05/12 (from the past 24 hour(s))  URINALYSIS, ROUTINE W REFLEX MICROSCOPIC     Status: Abnormal   Collection Time    09/05/12 10:30 AM      Result Value Range   Color, Urine YELLOW  YELLOW   APPearance HAZY (*)  CLEAR   Specific Gravity, Urine 1.020  1.005 - 1.030   pH 7.0  5.0 - 8.0   Glucose, UA NEGATIVE  NEGATIVE mg/dL   Hgb urine dipstick NEGATIVE  NEGATIVE   Bilirubin Urine NEGATIVE  NEGATIVE   Ketones, ur NEGATIVE  NEGATIVE mg/dL   Protein, ur NEGATIVE  NEGATIVE mg/dL   Urobilinogen, UA 0.2  0.0 - 1.0 mg/dL   Nitrite NEGATIVE  NEGATIVE   Leukocytes, UA TRACE (*) NEGATIVE  URINE MICROSCOPIC-ADD ON     Status: Abnormal   Collection Time    09/05/12 10:30 AM      Result Value Range   Squamous Epithelial / LPF FEW (*) RARE   WBC, UA 0-2  <3 WBC/hpf   Bacteria, UA FEW (*) RARE  WET PREP, GENITAL     Status: Abnormal   Collection Time    09/05/12 11:00 AM      Result Value Range   Yeast Wet Prep HPF POC NONE SEEN  NONE SEEN   Trich, Wet Prep NONE SEEN  NONE SEEN   Clue Cells  Wet Prep HPF POC MODERATE (*) NONE SEEN   WBC, Wet Prep HPF POC FEW (*) NONE SEEN    MAU Course  Procedures 1. U/a, wet prep, gc/ct, Motrin 800mg  po x1  Assessment and Plan  1. [redacted]w[redacted]d 2. Acute pelvic and low back pain 3. Irritability on TOCO 4. BV 5. FHT appropriate for GA  1. Pt's discomfort and UI resolved after po Motrin.  Unable to do FFN secondary to less than 24 hrs post-IC. 2. Rx's given for Flagyl 500mg  po bid and Motrin 600mg  po q6hr x24hrs, then prn.  Also rec'd probiotic while on Flagyl, and increase water intake today. 3.  Pt missed appt 08/26/12, and is to call to reschedule ROB ASAP, or f/u prn 4. PTL s/s rev'd  Francely Craw H 09/05/2012, 11:24 AM

## 2012-09-05 NOTE — MAU Note (Signed)
Pt presents with complaints of pelvic pain since this morning. Denies any bleeding or leakage of fluid.

## 2012-09-07 LAB — GC/CHLAMYDIA PROBE AMP
CT Probe RNA: NEGATIVE
GC Probe RNA: NEGATIVE

## 2012-09-09 ENCOUNTER — Ambulatory Visit: Payer: Medicaid Other | Admitting: Family Medicine

## 2012-09-09 VITALS — BP 102/64 | Wt 135.0 lb

## 2012-09-09 DIAGNOSIS — Z331 Pregnant state, incidental: Secondary | ICD-10-CM

## 2012-09-09 NOTE — Progress Notes (Signed)
[redacted]w[redacted]d No complaints, contraction subsided since MAU visit on 2/23 Next visit glucola, repeat U/S for pyelectasis. ROB in 3 weeks. L.Shellsea Borunda, FNP-BC

## 2012-09-09 NOTE — Progress Notes (Deleted)
[redacted]w[redacted]d No complaints today.

## 2012-09-09 NOTE — Patient Instructions (Addendum)
Preterm Labor Preterm labor is when labor starts at less than 37 weeks of pregnancy. The normal length of a pregnancy is 39 to 41 weeks. CAUSES Often, there is no identifiable underlying cause as to why a woman goes into preterm labor. However, one of the most common known causes of preterm labor is infection. Infections of the uterus, cervix, vagina, amniotic sac, bladder, kidney, or even the lungs (pneumonia) can cause labor to start. Other causes of preterm labor include:  Urogenital infections, such as yeast infections and bacterial vaginosis.  Uterine abnormalities (uterine shape, uterine septum, fibroids, bleeding from the placenta).  A cervix that has been operated on and opens prematurely.  Malformations in the baby.  Multiple gestations (twins, triplets, and so on).  Breakage of the amniotic sac. Additional risk factors for preterm labor include:  Previous history of preterm labor.  Premature rupture of membranes (PROM).  A placenta that covers the opening of the cervix (placenta previa).  A placenta that separates from the uterus (placenta abruption).  A cervix that is too weak to hold the baby in the uterus (incompetence cervix).  Having too much fluid in the amniotic sac (polyhydramnios).  Taking illegal drugs or smoking while pregnant.  Not gaining enough weight while pregnant.  Women younger than 53 and older than 22 years old.  Low socioeconomic status.  African-American ethnicity. SYMPTOMS Signs and symptoms of preterm labor include:  Menstrual-like cramps.  Contractions that are 30 to 70 seconds apart, become very regular, closer together, and are more intense and painful.  Contractions that start on the top of the uterus and spread down to the lower abdomen and back.  A sense of increased pelvic pressure or back pain.  A watery or bloody discharge that comes from the vagina. DIAGNOSIS  A diagnosis can be confirmed by:  A vaginal exam.  An  ultrasound of the cervix.  Sampling (swabbing) cervico-vaginal secretions. These samples can be tested for the presence of fetal fibronectin. This is a protein found in cervical discharge which is associated with preterm labor.  Fetal monitoring. TREATMENT  Depending on the length of the pregnancy and other circumstances, a caregiver may suggest bed rest. If necessary, there are medicines that can be given to stop contractions and to quicken fetal lung maturity. If labor happens before 34 weeks of pregnancy, a prolonged hospital stay may be recommended. Treatment depends on the condition of both the mother and baby. PREVENTION There are some things a mother can do to lower the risk of preterm labor in future pregnancies. A woman can:   Stop smoking.  Maintain healthy weight gain and avoid chemicals and drugs that are not necessary.  Be watchful for any type of infection.  Inform her caregiver if she has a known history of preterm labor. Document Released: 09/20/2003 Document Revised: 09/22/2011 Document Reviewed: 10/25/2010 [facility] Patient Information 2013 [name], [place].   Fetal Movement Counts Patient Name: __________________________________________________ Patient Due Date: ____________________ Laurie [name] counts is highly recommended in high risk pregnancies, but it is a good idea for every pregnant woman to do. Start counting fetal movements at 28 weeks of the pregnancy. Fetal movements increase after eating a full meal or eating or drinking something sweet (the blood sugar is higher). It is also important to drink plenty of fluids (well hydrated) before doing the count. Lie on your left side because it helps with the circulation or you can sit in a comfortable chair with your arms over your belly (abdomen) with no  distractions around you. DOING THE COUNT  Try to do the count the same time of day each time you do it.  Mark the day and time, then see how long it takes for you to feel 10  movements (kicks, flutters, swishes, rolls). You should have at least 10 movements within 2 hours. You will most likely feel 10 movements in much less than 2 hours. If you do not, wait an hour and count again. After a couple of days you will see a pattern.  What you are looking for is a change in the pattern or not enough counts in 2 hours. Is it taking longer in time to reach 10 movements? SEEK MEDICAL CARE IF:  You feel less than 10 counts in 2 hours. Tried twice.  No movement in one hour.  The pattern is changing or taking longer each day to reach 10 counts in 2 hours.  You feel the baby is not moving as it usually does. Date: ____________ Movements: ____________ Start time: ____________ Laurie [name] time: ____________  Date: ____________ Movements: ____________ Start time: ____________ Laurie [name] time: ____________ Date: ____________ Movements: ____________ Start time: ____________ Laurie [name] time: ____________ Date: ____________ Movements: ____________ Start time: ____________ Laurie [name] time: ____________ Date: ____________ Movements: ____________ Start time: ____________ Laurie [name] time: ____________ Date: ____________ Movements: ____________ Start time: ____________ Laurie [name] time: ____________ Date: ____________ Movements: ____________ Start time: ____________ Laurie [name] time: ____________ Date: ____________ Movements: ____________ Start time: ____________ Laurie [name] time: ____________  Date: ____________ Movements: ____________ Start time: ____________ Laurie [name] time: ____________ Date: ____________ Movements: ____________ Start time: ____________ Laurie [name] time: ____________ Date: ____________ Movements: ____________ Start time: ____________ Laurie [name] time: ____________ Date: ____________ Movements: ____________ Start time: ____________ Laurie [name] time: ____________ Date: ____________ Movements: ____________ Start time: ____________ Laurie [name] time: ____________ Date: ____________ Movements: ____________ Start time: ____________  Laurie [name] time: ____________ Date: ____________ Movements: ____________ Start time: ____________ Laurie [name] time: ____________  Date: ____________ Movements: ____________ Start time: ____________ Laurie [name] time: ____________ Date: ____________ Movements: ____________ Start time: ____________ Laurie [name] time: ____________ Date: ____________ Movements: ____________ Start time: ____________ Laurie [name] time: ____________ Date: ____________ Movements: ____________ Start time: ____________ Laurie [name] time: ____________ Date: ____________ Movements: ____________ Start time: ____________ Laurie [name] time: ____________ Date: ____________ Movements: ____________ Start time: ____________ Laurie [name] time: ____________ Date: ____________ Movements: ____________ Start time: ____________ Laurie [name] time: ____________  Date: ____________ Movements: ____________ Start time: ____________ Laurie [name] time: ____________ Date: ____________ Movements: ____________ Start time: ____________ Laurie [name] time: ____________ Date: ____________ Movements: ____________ Start time: ____________ Laurie [name] time: ____________ Date: ____________ Movements: ____________ Start time: ____________ Laurie [name] time: ____________ Date: ____________ Movements: ____________ Start time: ____________ Laurie [name] time: ____________ Date: ____________ Movements: ____________ Start time: ____________ Laurie [name] time: ____________ Date: ____________ Movements: ____________ Start time: ____________ Laurie [name] time: ____________  Date: ____________ Movements: ____________ Start time: ____________ Laurie [name] time: ____________ Date: ____________ Movements: ____________ Start time: ____________ Laurie [name] time: ____________ Date: ____________ Movements: ____________ Start time: ____________ Laurie [name] time: ____________ Date: ____________ Movements: ____________ Start time: ____________ Laurie [name] time: ____________ Date: ____________ Movements: ____________ Start time: ____________ Laurie [name] time: ____________ Date: ____________  Movements: ____________ Start time: ____________ Laurie [name] time: ____________ Date: ____________ Movements: ____________ Start time: ____________ Laurie [name] time: ____________  Date: ____________ Movements: ____________ Start time: ____________ Laurie [name] time: ____________ Date: ____________ Movements: ____________ Start time: ____________ Laurie [name] time: ____________ Date: ____________ Movements: ____________ Start time: ____________ Laurie [name] time: ____________ Date: ____________ Movements: ____________ Start time: ____________ Laurie [name] time: ____________ Date: ____________ Movements: ____________ Start time: ____________ Laurie [name] time: ____________ Date: ____________ Movements: ____________ Start time: ____________ Laurie [name] time: ____________ Date: ____________ Movements: ____________  Start time: ____________ Laurie [name] time: ____________  Date: ____________ Movements: ____________ Start time: ____________ Laurie [name] time: ____________ Date: ____________ Movements: ____________ Start time: ____________ Laurie [name] time: ____________ Date: ____________ Movements: ____________ Start time: ____________ Laurie [name] time: ____________ Date: ____________ Movements: ____________ Start time: ____________ Laurie [name] time: ____________ Date: ____________ Movements: ____________ Start time: ____________ Laurie [name] time: ____________ Date: ____________ Movements: ____________ Start time: ____________ Laurie [name] time: ____________ Date: ____________ Movements: ____________ Start time: ____________ Laurie [name] time: ____________  Date: ____________ Movements: ____________ Start time: ____________ Laurie [name] time: ____________ Date: ____________ Movements: ____________ Start time: ____________ Laurie [name] time: ____________ Date: ____________ Movements: ____________ Start time: ____________ Laurie [name] time: ____________ Date: ____________ Movements: ____________ Start time: ____________ Laurie [name] time: ____________ Date: ____________ Movements: ____________ Start time:  ____________ Laurie [name] time: ____________ Date: ____________ Movements: ____________ Start time: ____________ Laurie [name] time: ____________ Document Released: 07/30/2006 Document Revised: 09/22/2011 Document Reviewed: 01/30/2009 [employer] Patient Information 2013 [employer].

## 2012-09-30 ENCOUNTER — Ambulatory Visit: Payer: Medicaid Other

## 2012-09-30 ENCOUNTER — Ambulatory Visit: Payer: Medicaid Other | Admitting: Obstetrics and Gynecology

## 2012-09-30 ENCOUNTER — Other Ambulatory Visit: Payer: Self-pay | Admitting: Obstetrics and Gynecology

## 2012-09-30 ENCOUNTER — Encounter: Payer: Self-pay | Admitting: Obstetrics and Gynecology

## 2012-09-30 VITALS — BP 100/60 | Wt 138.0 lb

## 2012-09-30 LAB — CBC
Hemoglobin: 11.4 g/dL — ABNORMAL LOW (ref 12.0–15.0)
MCH: 29.8 pg (ref 26.0–34.0)
MCHC: 33 g/dL (ref 30.0–36.0)
MCV: 90.3 fL (ref 78.0–100.0)

## 2012-09-30 NOTE — Progress Notes (Signed)
[redacted]w[redacted]d Ultrasound shows:  SIUP  S=D     [place] EDD: 12/18/2012            EFW: 2 lb 13 oz 36.1 %           AFI: 15.66 - normal 55th%           Cervical length: 4.70 cm           Placenta localization: anterior           Fetal presentation: vertex Comments: Pyelectasis is resolved, hypocoiled cord is noted

## 2012-09-30 NOTE — Progress Notes (Signed)
[redacted]w[redacted]d Glucola today O+ ultrasound findings reviewed: pyelectasis resolved, hypocoiled cord will require fetal testing starting at 32 weeks

## 2012-09-30 NOTE — Progress Notes (Signed)
[redacted]w[redacted]d Glucola today  Pt has no complaints  F/U Pyelectasis

## 2012-10-01 LAB — RPR

## 2012-10-11 LAB — US OB FOLLOW UP

## 2012-11-19 LAB — OB RESULTS CONSOLE GBS: GBS: NEGATIVE

## 2012-11-21 ENCOUNTER — Inpatient Hospital Stay (HOSPITAL_COMMUNITY)
Admission: AD | Admit: 2012-11-21 | Discharge: 2012-11-21 | Disposition: A | Discharge status: Home / Self Care | Payer: Medicaid Other | Source: Ambulatory Visit | Attending: Obstetrics and Gynecology | Admitting: Obstetrics and Gynecology

## 2012-11-21 ENCOUNTER — Encounter (HOSPITAL_COMMUNITY): Payer: Self-pay | Admitting: *Deleted

## 2012-11-21 DIAGNOSIS — R109 Unspecified abdominal pain: Secondary | ICD-10-CM | POA: Insufficient documentation

## 2012-11-21 DIAGNOSIS — R51 Headache: Secondary | ICD-10-CM | POA: Insufficient documentation

## 2012-11-21 DIAGNOSIS — O99891 Other specified diseases and conditions complicating pregnancy: Secondary | ICD-10-CM | POA: Insufficient documentation

## 2012-11-21 DIAGNOSIS — J45909 Unspecified asthma, uncomplicated: Secondary | ICD-10-CM

## 2012-11-21 DIAGNOSIS — O4700 False labor before 37 completed weeks of gestation, unspecified trimester: Secondary | ICD-10-CM | POA: Insufficient documentation

## 2012-11-21 DIAGNOSIS — O358XX0 Maternal care for other (suspected) fetal abnormality and damage, not applicable or unspecified: Secondary | ICD-10-CM

## 2012-11-21 LAB — URINALYSIS, ROUTINE W REFLEX MICROSCOPIC
Bilirubin Urine: NEGATIVE
Glucose, UA: NEGATIVE mg/dL
Hgb urine dipstick: NEGATIVE
Specific Gravity, Urine: 1.02 (ref 1.005–1.030)
Urobilinogen, UA: 1 mg/dL (ref 0.0–1.0)
pH: 7 (ref 5.0–8.0)

## 2012-11-21 MED ORDER — ACETAMINOPHEN 325 MG PO TABS: 650.0000 mg | ORAL_TABLET | Freq: Once | ORAL | Status: DC

## 2012-11-21 NOTE — MAU Note (Signed)
Pt presents with complaints of cramping for about 2 weeks but called and was told she was to come in when she had pains because she has had a previous preterm delivery.

## 2012-11-21 NOTE — MAU Provider Note (Signed)
Pt c/o of cramping.  She also has a mild headache.  She has not had anything to eat.   BP 114/57  Pulse 89  Temp(Src) 98.2 F (36.8 C) (Oral)  Resp 18  Ht 5\' 5"  (1.651 m)  Wt 152 lb (68.947 kg)  BMI 25.29 kg/m2  LMP 03/28/2012 NST  Category 1 toco ctx q 5-7 minutes cx 1/ long/ -3 Tylenol for headache PO hydrate And try food.

## 2012-11-27 ENCOUNTER — Inpatient Hospital Stay (HOSPITAL_COMMUNITY)
Admission: AD | Admit: 2012-11-27 | Discharge: 2012-11-27 | Disposition: A | Discharge status: Home / Self Care | Payer: Medicaid Other | Source: Ambulatory Visit | Attending: Obstetrics and Gynecology | Admitting: Obstetrics and Gynecology

## 2012-11-27 ENCOUNTER — Encounter (HOSPITAL_COMMUNITY): Payer: Self-pay

## 2012-11-27 DIAGNOSIS — O212 Late vomiting of pregnancy: Secondary | ICD-10-CM | POA: Insufficient documentation

## 2012-11-27 DIAGNOSIS — J069 Acute upper respiratory infection, unspecified: Secondary | ICD-10-CM | POA: Insufficient documentation

## 2012-11-27 DIAGNOSIS — O99891 Other specified diseases and conditions complicating pregnancy: Secondary | ICD-10-CM | POA: Insufficient documentation

## 2012-11-27 LAB — URINALYSIS, ROUTINE W REFLEX MICROSCOPIC
Glucose, UA: NEGATIVE mg/dL
Hgb urine dipstick: NEGATIVE
Specific Gravity, Urine: 1.025 (ref 1.005–1.030)
pH: 7 (ref 5.0–8.0)

## 2012-11-27 LAB — URINE MICROSCOPIC-ADD ON

## 2012-11-27 MED ORDER — ONDANSETRON 4 MG PO TBDP
4.0000 mg | ORAL_TABLET | Freq: Once | ORAL | Status: AC
  Administered 2012-11-27: 4 mg via ORAL
  Filled 2012-11-27: qty 1

## 2012-11-27 MED ORDER — ONDANSETRON 8 MG PO TBDP: 8.0000 mg | ORAL_TABLET | Freq: Three times a day (TID) | ORAL | Status: DC | PRN

## 2012-11-27 MED ORDER — IPRATROPIUM BROMIDE 0.02 % IN SOLN
0.5000 mg | Freq: Once | RESPIRATORY_TRACT | Status: AC
  Administered 2012-11-27: 0.5 mg via RESPIRATORY_TRACT
  Filled 2012-11-27: qty 2.5

## 2012-11-27 MED ORDER — PSEUDOEPHEDRINE HCL 30 MG PO TABS
60.0000 mg | ORAL_TABLET | Freq: Once | ORAL | Status: AC
Start: 2012-11-27 — End: 2012-11-27
  Administered 2012-11-27: 60 mg via ORAL
  Filled 2012-11-27: qty 2

## 2012-11-27 NOTE — MAU Note (Signed)
Pt states for past two days has felt nauseated and had multiple episodes of vomiting last night and this am. Sinuses are congested. No one else in home has been sick. Feels weak. Denies bleeding/discharge.

## 2012-11-27 NOTE — Progress Notes (Signed)
Provider reviewed strip. Order from provider to take pt off monitors

## 2012-11-27 NOTE — MAU Provider Note (Signed)
History     CSN: [id number]  Arrival date and time: 11/27/12 1759   First Provider Initiated Contact with Patient 11/27/12 2005      Chief Complaint  Patient presents with  . Sinus pain, N/V    HPI Comments: Pt is a 22yo G3P1102 at 37wks that arrived unannounced w c/o nausea/vomiting the last 2 days. Denies any fever, aches, chills, denies diarrhea or constipation. No other sick fam members, also had head congestion last 4 days, had a sore throat but is better now. Has not tried any OTC meds. Denies any pain, no ctx, VB or LOF, GFM.  Pt has a hx of asthma that has been stable, but did use inhaler yesterday. Denies SOB or chest pain, no cough.      Past Medical History  Diagnosis Date  . Abnormal Pap smear     LAST PAP 09/2010  . Preterm labor 2011  . Anemia     CHILDHOOD  . Headache     FREQUENT  . Asthma 2010  . 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 No past surgeries      Family History  Problem Relation Age of Onset  . Hypertension Father   . Asthma Father   . Asthma Brother   . Arthritis Paternal Aunt     History  Substance Use Topics  . Smoking status: Passive Smoke Exposure - Never Smoker  . Smokeless tobacco: Never Used  . Alcohol Use: Yes     Comment: OCC    Allergies: No Known Allergies  Prescriptions prior to admission  Medication Sig Dispense Refill  . albuterol (PROVENTIL HFA;VENTOLIN HFA) 108 (90 BASE) MCG/ACT inhaler Inhale 2 puffs into the lungs every 6 (six) hours as needed for wheezing.  1 Inhaler  2  . Prenatal Vit-Fe Fumarate-FA (PRENATAL MULTIVITAMIN) TABS Take 1 tablet by mouth daily.  90 tablet  3    Review of Systems  Constitutional: Negative for fever and chills.  HENT: Positive for congestion. Negative for ear pain and sore throat.   Eyes: Negative for blurred vision.  Respiratory: Positive for sputum production and wheezing. Negative for cough and shortness of breath.    Cardiovascular: Negative for chest pain and palpitations.  Gastrointestinal: Positive for nausea and vomiting. Negative for heartburn, abdominal pain, diarrhea and constipation.  Musculoskeletal: Negative for myalgias.  Neurological: Negative for headaches.   Physical Exam   Blood pressure 123/74, pulse 93, temperature 98 F (36.7 C), temperature source Oral, resp. rate 18, height 5\' 6"  (1.676 m), weight 158 lb (71.668 kg), last menstrual period 03/28/2012, SpO2 100.00%, unknown if currently breastfeeding.  Physical Exam  Nursing note and vitals reviewed. Constitutional: She is oriented to person, place, and time. She appears well-developed and well-nourished.  HENT:  Head: Normocephalic.  Nose: Right sinus exhibits no maxillary sinus tenderness and no frontal sinus tenderness. Left sinus exhibits no maxillary sinus tenderness and no frontal sinus tenderness.  Mouth/Throat: Oropharynx is clear and moist and mucous membranes are normal.  Nasal turbinates slightly erythematous and edematous   Eyes: Pupils are equal, round, and reactive to light.  Neck: Normal range of motion.  Cardiovascular: Normal rate, regular rhythm and normal heart sounds.   Respiratory: Effort normal. She has no wheezes.  Diminished breath sounds lower lobes bilaterally   GI: Soft. Bowel sounds are normal. There is no tenderness.  AGA  Genitourinary:  Deferred   Musculoskeletal: Normal range of  motion.  Lymphadenopathy:       Head (right side): Submental and submandibular adenopathy present.       Head (left side): Submental and submandibular adenopathy present.    She has no cervical adenopathy.  Neurological: She is alert and oriented to person, place, and time. She has normal reflexes.  Skin: Skin is warm and dry.  Psychiatric: She has a normal mood and affect. Her behavior is normal.    MAU Course  Procedures    Assessment and Plan  IUP at 37wks UA normal Likely viral illness/URI FHR cat 1,  toco quiet  Will give nebulizer tx Sudafed rcv'd PO zofran w improvement of nausea  Will d/c home, recommend sudafed PRN, saline spray, clear liquid diet for tonight, may advance slowly to BRAT tmrw Will give RX for zofran  Cecylia Brazill M 11/27/2012, 8:09 PM

## 2012-11-27 NOTE — Progress Notes (Signed)
Pt states she feels a lot better. No c/o pain. Discharge orders given.

## 2012-11-28 LAB — URINE CULTURE

## 2012-12-15 ENCOUNTER — Inpatient Hospital Stay (HOSPITAL_COMMUNITY): Payer: Medicaid Other | Admitting: Anesthesiology

## 2012-12-15 ENCOUNTER — Inpatient Hospital Stay (HOSPITAL_COMMUNITY): Payer: Medicaid Other

## 2012-12-15 ENCOUNTER — Encounter (HOSPITAL_COMMUNITY): Payer: Self-pay | Admitting: Anesthesiology

## 2012-12-15 ENCOUNTER — Inpatient Hospital Stay (HOSPITAL_COMMUNITY)
Admission: AD | Admit: 2012-12-15 | Discharge: 2012-12-17 | DRG: 775 | Disposition: A | Discharge status: Home / Self Care | Payer: Medicaid Other | Source: Ambulatory Visit | Attending: Obstetrics and Gynecology | Admitting: Obstetrics and Gynecology

## 2012-12-15 ENCOUNTER — Encounter (HOSPITAL_COMMUNITY): Payer: Self-pay | Admitting: *Deleted

## 2012-12-15 DIAGNOSIS — Z9289 Personal history of other medical treatment: Secondary | ICD-10-CM

## 2012-12-15 DIAGNOSIS — O368190 Decreased fetal movements, unspecified trimester, not applicable or unspecified: Principal | ICD-10-CM | POA: Diagnosis present

## 2012-12-15 DIAGNOSIS — O358XX0 Maternal care for other (suspected) fetal abnormality and damage, not applicable or unspecified: Secondary | ICD-10-CM | POA: Diagnosis present

## 2012-12-15 LAB — CBC
HCT: 34.5 % — ABNORMAL LOW (ref 36.0–46.0)
Hemoglobin: 11.3 g/dL — ABNORMAL LOW (ref 12.0–15.0)
MCH: 29.4 pg (ref 26.0–34.0)
MCV: 89.8 fL (ref 78.0–100.0)
RBC: 3.84 MIL/uL — ABNORMAL LOW (ref 3.87–5.11)

## 2012-12-15 MED ORDER — PHENYLEPHRINE 40 MCG/ML (10ML) SYRINGE FOR IV PUSH (FOR BLOOD PRESSURE SUPPORT)
80.0000 ug | PREFILLED_SYRINGE | INTRAVENOUS | Status: DC | PRN
  Filled 2012-12-15: qty 2

## 2012-12-15 MED ORDER — OXYCODONE-ACETAMINOPHEN 5-325 MG PO TABS: 1.0000 | ORAL_TABLET | ORAL | Status: DC | PRN

## 2012-12-15 MED ORDER — CITRIC ACID-SODIUM CITRATE 334-500 MG/5ML PO SOLN: 30.0000 mL | ORAL | Status: DC | PRN

## 2012-12-15 MED ORDER — PHENYLEPHRINE 40 MCG/ML (10ML) SYRINGE FOR IV PUSH (FOR BLOOD PRESSURE SUPPORT)
80.0000 ug | PREFILLED_SYRINGE | INTRAVENOUS | Status: DC | PRN
  Filled 2012-12-15: qty 2
  Filled 2012-12-15: qty 5

## 2012-12-15 MED ORDER — LACTATED RINGERS IV SOLN
500.0000 mL | INTRAVENOUS | Status: DC | PRN
Start: 2012-12-15 — End: 2012-12-16

## 2012-12-15 MED ORDER — LIDOCAINE HCL (PF) 1 % IJ SOLN
30.0000 mL | INTRAMUSCULAR | Status: DC | PRN
  Filled 2012-12-15 (×2): qty 30

## 2012-12-15 MED ORDER — FENTANYL 2.5 MCG/ML BUPIVACAINE 1/10 % EPIDURAL INFUSION (WH - ANES)
INTRAMUSCULAR | Status: DC | PRN
  Administered 2012-12-15: 14 mL/h via EPIDURAL

## 2012-12-15 MED ORDER — OXYTOCIN BOLUS FROM INFUSION
500.0000 mL | INTRAVENOUS | Status: DC
  Administered 2012-12-16: 500 mL via INTRAVENOUS

## 2012-12-15 MED ORDER — LACTATED RINGERS IV SOLN
INTRAVENOUS | Status: DC
  Administered 2012-12-15 (×2): via INTRAVENOUS

## 2012-12-15 MED ORDER — FLEET ENEMA 7-19 GM/118ML RE ENEM: 1.0000 | ENEMA | RECTAL | Status: DC | PRN

## 2012-12-15 MED ORDER — ONDANSETRON HCL 4 MG/2ML IJ SOLN: 4.0000 mg | Freq: Four times a day (QID) | INTRAMUSCULAR | Status: DC | PRN

## 2012-12-15 MED ORDER — EPHEDRINE 5 MG/ML INJ
10.0000 mg | INTRAVENOUS | Status: DC | PRN
  Filled 2012-12-15: qty 2

## 2012-12-15 MED ORDER — TERBUTALINE SULFATE 1 MG/ML IJ SOLN: 0.2500 mg | Freq: Once | INTRAMUSCULAR | Status: AC | PRN

## 2012-12-15 MED ORDER — LIDOCAINE HCL (PF) 1 % IJ SOLN
INTRAMUSCULAR | Status: DC | PRN
  Administered 2012-12-15 (×2): 5 mL
  Administered 2012-12-15: 3 mL

## 2012-12-15 MED ORDER — ACETAMINOPHEN 325 MG PO TABS: 650.0000 mg | ORAL_TABLET | ORAL | Status: DC | PRN

## 2012-12-15 MED ORDER — DIPHENHYDRAMINE HCL 50 MG/ML IJ SOLN: 12.5000 mg | INTRAMUSCULAR | Status: DC | PRN

## 2012-12-15 MED ORDER — OXYTOCIN 40 UNITS IN LACTATED RINGERS INFUSION - SIMPLE MED
62.5000 mL/h | INTRAVENOUS | Status: DC
  Administered 2012-12-16: 62.5 mL/h via INTRAVENOUS

## 2012-12-15 MED ORDER — OXYTOCIN 40 UNITS IN LACTATED RINGERS INFUSION - SIMPLE MED
1.0000 m[IU]/min | INTRAVENOUS | Status: DC
  Administered 2012-12-15: 1 m[IU]/min via INTRAVENOUS
  Filled 2012-12-15: qty 1000

## 2012-12-15 MED ORDER — LACTATED RINGERS IV SOLN
500.0000 mL | Freq: Once | INTRAVENOUS | Status: AC
  Administered 2012-12-15: 500 mL via INTRAVENOUS

## 2012-12-15 MED ORDER — EPHEDRINE 5 MG/ML INJ
10.0000 mg | INTRAVENOUS | Status: DC | PRN
  Filled 2012-12-15: qty 4
  Filled 2012-12-15: qty 2

## 2012-12-15 MED ORDER — IBUPROFEN 600 MG PO TABS: 600.0000 mg | ORAL_TABLET | Freq: Four times a day (QID) | ORAL | Status: DC | PRN

## 2012-12-15 MED ORDER — FENTANYL 2.5 MCG/ML BUPIVACAINE 1/10 % EPIDURAL INFUSION (WH - ANES)
14.0000 mL/h | INTRAMUSCULAR | Status: DC | PRN
  Filled 2012-12-15: qty 125

## 2012-12-15 MED ORDER — LIDOCAINE HCL (PF) 1 % IJ SOLN
INTRAMUSCULAR | Status: DC | PRN
  Administered 2012-12-15: 5 mL
  Administered 2012-12-15: 3 mL
  Administered 2012-12-15: 5 mL

## 2012-12-15 NOTE — MAU Note (Signed)
C/o vaginal bleeding and cramping earlier today; brought in by EMS;

## 2012-12-15 NOTE — Anesthesia Procedure Notes (Signed)
Epidural Patient location during procedure: OB  Staffing Anesthesiologist: Seeley Hissong Performed by: anesthesiologist   Preanesthetic Checklist Completed: patient identified, site marked, surgical consent, pre-op evaluation, timeout performed, IV checked, risks and benefits discussed and monitors and equipment checked  Epidural Patient position: sitting Prep: ChloraPrep Patient monitoring: heart rate, continuous pulse ox and blood pressure Approach: midline Injection technique: LOR saline  Needle:  Needle type: Tuohy  Needle gauge: 17 G Needle length: 9 cm and 9 Needle insertion depth: 5 cm Catheter type: closed end flexible Catheter size: 20 Guage Catheter at skin depth: 10 cm Test dose: negative  Assessment Events: blood not aspirated, injection not painful, no injection resistance, negative IV test and no paresthesia  Additional Notes   Patient tolerated the insertion well without complications.   

## 2012-12-15 NOTE — H&P (Signed)
Laurie [name] is a 22 y.o. female, [id number] at 41 4/7 weeks, admitted due to BPP 4/8 in MAU (-2 for movement and tone), normal fluid.  Seen in MAU for small amount of bleeding and decreased FM today.  Patient was seen in the office earlier today, with cervical exam of 3-4, 90%, vtx, 0 station.  Had small amount BRB today, and was transported to [facility] via EMS.  Cervix was the same on exam in MAU, with FHR initially NR, sent to BPP, but FHR eventually reactive prior to going to [place].  History of present pregnancy: Patient entered care at 60 2/7 weeks.  Had [place] at 6 1/7 weeks at MAU, with EDC of 12/18/12, but no fetal pole noted. EDC of 12/18/12 was established by 6 week [place], and confirmed by 18 week scan.   Anatomy scan:  18 2/7 weeks, with bilateral fetal renal pyelectasis noted and an anterior placenta.   Additional [place] evaluations:   28 5/7 weeks--resolution of FRP, growth at 36 %ile and AFI at 55%ile.  Normal cervical length. 35 weeks = EFW 5+15, 46%ile, AFI low-normal at 11.0, 30%ile, anterior placenta Significant prenatal events:  ASCUS on pap at NOB, with negative HPV--plan to f/u in 1 year.  Reporting having fallen at approx 17 weeks, but no evaluation until NOB at 18 2/7 weeks.  Hypocoiled cord noted at 18 weeks, with growth followed q 4 weeks.  Normal Quad screen Last evaluation:  12/15/12, with cervix as noted above.  Patient Active Problem List   Diagnosis Date Noted  . H/O fetal biophysical profile 4/8 12/15/2012  . 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 Pregnancy with history of pre-term labor 07/26/2012  . Pyelectasis of fetus on prenatal ultrasound 07/19/2012  . Asthma 07/16/2012  Hypocoiled cord Bilateral pyelectasis resolved at 28 weeks.  History OB History   Grav Para Term Preterm Abortions TAB SAB Ect Mult Living   3 2 1 1  0 0 0 0 0 2    2009--[facility], 40 4/7 weeks, 5 hour labor, female, 6+9, epidural, delivered at [facility] 2011--[facility], 36 weeks, induced for IUGR/SGA, 12 hour labor,  female, 6+5, epidural, delivered at [facility]  Past Medical History  Diagnosis Date  . Abnormal Pap smear     LAST PAP 09/2010  . Preterm labor 2011  . Anemia     CHILDHOOD  . Headache(784.0)     FREQUENT  . Asthma 2010  . 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  Past Surgical History  Procedure Laterality Date  . No past surgeries     Family History: family history includes Arthritis in her paternal aunt; Asthma in her brother and father; and Hypertension in her father.  Social History:  reports that she has been passively smoking.  She has never used smokeless tobacco. She reports that  drinks alcohol. She reports that she does not use illicit drugs.  FOB, Canary Brim, is involved and supportive.   Prenatal Transfer Tool  Maternal Diabetes: No Genetic Screening: Normal Maternal Ultrasounds/Referrals: Abnormal:  Findings:   Fetal renal pyelectasis, bilateral, resolved at 28 weeks;  hypocoiled cord Fetal Ultrasounds or other Referrals:  None Maternal Substance Abuse:  No Significant Maternal Medications:  None Significant Maternal Lab Results:  Lab values include: Group B Strep negative Other Comments:  Presented with BRB today, minimal on exam.  BPP 4/8 (-2 for movement and tone), normal AFI.  ROS:  Sporadic contractions, decreased FM, small amount  bloody show.   Dilation: 3.5 Effacement (%): 80 Station: -3 Exam by:: V Kobyn Kray CNM Blood pressure 94/63, pulse 78, temperature 98.2 F (36.8 C), temperature source Oral, resp. rate 16, height 5\' 6"  (1.676 m), weight 160 lb (72.576 kg), last menstrual period 03/28/2012.  Exam Physical Exam   Chest clear Heart RRR without murmur Abd gravid, NT Pelvic--as above, BBOW, small amount bloody show on glove Ext WNL  FHR reactive at intervals UCs irritability, occasional organized contractions.  Prenatal labs: ABO, Rh: O/POS/-- (01/03 1150) Antibody: NEG (01/03  1150) Rubella: 21.90 (01/03 1150) RPR: NON REAC (03/20 1659)  HBsAg: NEGATIVE (01/03 1150)  HIV: NON REACTIVE (01/03 1150)  GBS: Negative (05/09 0000)  Pap ASCUS with negative HPV Quad screen WNL Cultures negative at NOB and in February Normal glucola at 28 weeks Negative Hgb electrophoresis Hgb 11.8 at NOB, 12 at 28 weeks. Platelets 281 at NOB, 236 at 28 weeks.  Assessment/Plan: IUP at 39 4/7 weeks BPP 4/8, with reactive NST Favorable cervix GBS negative  Plan: Admitted to [facility] per consult with Dr. Normand Sloop Routine [facility] orders Plan pitocin initiation/AROM as appropriate Pain medication prn  Close observation of FHR status.  Nigel Bridgeman 12/15/2012, 6:48 PM

## 2012-12-15 NOTE — Anesthesia Preprocedure Evaluation (Signed)
Anesthesia Evaluation  Patient identified by MRN, date of birth, ID band Patient awake    Reviewed: Allergy & Precautions, H&P , NPO status , Patient's Chart, lab work & pertinent test results  History of Anesthesia Complications Negative for: history of anesthetic complications  Airway Mallampati: II TM Distance: >3 FB Neck ROM: full    Dental no notable dental hx. (+) Teeth Intact   Pulmonary neg pulmonary ROS, asthma ,  breath sounds clear to auscultation  Pulmonary exam normal       Cardiovascular negative cardio ROS  Rhythm:regular Rate:Normal     Neuro/Psych  Headaches, Seizures -,  negative neurological ROS  negative psych ROS   GI/Hepatic negative GI ROS, Neg liver ROS,   Endo/Other  negative endocrine ROS  Renal/GU negative Renal ROS  negative genitourinary   Musculoskeletal   Abdominal Normal abdominal exam  (+)   Peds  Hematology negative hematology ROS (+)   Anesthesia Other Findings   Reproductive/Obstetrics (+) Pregnancy                           Anesthesia Physical Anesthesia Plan  ASA: II  Anesthesia Plan: Epidural   Post-op Pain Management:    Induction:   Airway Management Planned:   Additional Equipment:   Intra-op Plan:   Post-operative Plan:   Informed Consent: I have reviewed the patients History and Physical, chart, labs and discussed the procedure including the risks, benefits and alternatives for the proposed anesthesia with the patient or authorized representative who has indicated his/her understanding and acceptance.     Plan Discussed with:   Anesthesia Plan Comments:         Anesthesia Quick Evaluation

## 2012-12-15 NOTE — MAU Provider Note (Signed)
History   22 yo G3P1102 at 25 4/7 weeks presented via EMS c/o episode of BRB after getting out of shower--no active bleeding at present.  Had visit at office today at 10am, with cervical check 3-4 cm, 90%, vtx, 0 station.  Denies significant pain, no leaking.  Had called office with report of same, and also noted decreased FM today.  Had +FM last night.  No recent IC.  Last [place] 11/23/12 at 35 weeks = EFW 5+15, 46%ile, AFI low-normal at 11.0, 30%ile, anterior placenta  Patient Active Problem List   Diagnosis Date Noted  . 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 Pregnancy with history of pre-term labor 07/26/2012  . Pyelectasis of fetus on prenatal ultrasound 07/19/2012  . Asthma 07/16/2012  Hypocoiled cord Hx bilateral pyelectasis--resolved at 28 weeks Inhaler use prn. Hx SGA with previous pregnancy--induced at 36 weeks Hx seizure associated with migraine x 1   Chief Complaint  Patient presents with  . Vaginal Bleeding  . Abdominal Cramping     OB History   Grav Para Term Preterm Abortions TAB SAB Ect Mult Living   3 2 1 1  0 0 0 0 0 2      Past Medical History  Diagnosis Date  . Abnormal Pap smear     LAST PAP 09/2010  . Preterm labor 2011  . Anemia     CHILDHOOD  . Headache(784.0)     FREQUENT  . Asthma 2010  . 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 No past surgeries      Family History  Problem Relation Age of Onset  . Hypertension Father   . Asthma Father   . Asthma Brother   . Arthritis Paternal Aunt     History  Substance Use Topics  . Smoking status: Passive Smoke Exposure - Never Smoker  . Smokeless tobacco: Never Used  . Alcohol Use: Yes     Comment: OCC    Allergies: No Known Allergies  Prescriptions prior to admission  Medication Sig Dispense Refill  . albuterol (PROVENTIL HFA;VENTOLIN HFA) 108 (90 BASE) MCG/ACT inhaler Inhale 2 puffs into the lungs every 6 (six) hours as needed for wheezing.  1  Inhaler  2  . ondansetron (ZOFRAN ODT) 8 MG disintegrating tablet Take 1 tablet (8 mg total) by mouth every 8 (eight) hours as needed for nausea.  20 tablet  0  . Prenatal Vit-Fe Fumarate-FA (PRENATAL MULTIVITAMIN) TABS Take 1 tablet by mouth daily.  90 tablet  3     Physical Exam   Blood pressure 94/63, pulse 78, temperature 98.2 F (36.8 C), temperature source Oral, resp. rate 16, height 5\' 6"  (1.676 m), weight 160 lb (72.576 kg), last menstrual period 03/28/2012.  Chest clear Heart RRR without murmur Abd gravid, NT, soft Pelvic--no active bleeding at present, cervix 3-4 cm, 75%, vtx -1/0 station.  Small amount bloody show on glove after exam Ext WNL   FHR reassuring, 10 beat accels at present UCs uterine irritability, very occasional more-defined contractions.  ED Course  IUP at 39 4/7 weeks 3rd trimester spotting--likely show GBS negative  Plan: Will CTO NST, contractions, vaginal bleeding, cervical status. BPP with AFI Re-evaluate in next 1-2 hours.   Nigel Bridgeman CNM, [place] 12/15/2012 5:07 PM  Addendum: Returned from US--verbal report from Dr. Chilton Si, radiologist. BPP 4/8, with -2 for tone and movement, AFI WNL, vtx. Consulted with Dr. Normand Sloop.  NST reactive prior to  patient going for [place], with cycles of irritability.  Will admit for induction of labor with pitocin. Plan close observation of FHR status. Routine [facility] orders.  Nigel Bridgeman, CNM 12/15/12 6:50p

## 2012-12-15 NOTE — Anesthesia Preprocedure Evaluation (Signed)

## 2012-12-15 NOTE — Anesthesia Procedure Notes (Signed)
Epidural Patient location during procedure: OB  Staffing Anesthesiologist: Phillips Grout Performed by: anesthesiologist   Preanesthetic Checklist Completed: patient identified, site marked, surgical consent, pre-op evaluation, timeout performed, IV checked, risks and benefits discussed and monitors and equipment checked  Epidural Patient position: sitting Prep: ChloraPrep Patient monitoring: heart rate, continuous pulse ox and blood pressure Injection technique: LOR saline  Needle:  Needle type: Tuohy  Needle gauge: 17 G Needle length: 9 cm and 9 Needle insertion depth: 6 cm Catheter type: closed end flexible Catheter size: 20 Guage Catheter at skin depth: 9 cm Test dose: negative  Assessment Events: blood not aspirated, injection not painful, no injection resistance, negative IV test and no paresthesia  Additional Notes   Patient tolerated the insertion well without complications.

## 2012-12-16 ENCOUNTER — Encounter (HOSPITAL_COMMUNITY): Payer: Self-pay

## 2012-12-16 LAB — CBC
MCH: 29.3 pg (ref 26.0–34.0)
MCHC: 32.7 g/dL (ref 30.0–36.0)
Platelets: 118 10*3/uL — ABNORMAL LOW (ref 150–400)
RBC: 3.82 MIL/uL — ABNORMAL LOW (ref 3.87–5.11)

## 2012-12-16 LAB — RPR: RPR Ser Ql: NONREACTIVE

## 2012-12-16 MED ORDER — MEASLES, MUMPS & RUBELLA VAC ~~LOC~~ INJ
0.5000 mL | INJECTION | Freq: Once | SUBCUTANEOUS | Status: DC
  Filled 2012-12-16: qty 0.5

## 2012-12-16 MED ORDER — METHYLERGONOVINE MALEATE 0.2 MG PO TABS: 0.2000 mg | ORAL_TABLET | ORAL | Status: DC | PRN

## 2012-12-16 MED ORDER — SENNOSIDES-DOCUSATE SODIUM 8.6-50 MG PO TABS: 2.0000 | ORAL_TABLET | Freq: Every day | ORAL | Status: DC

## 2012-12-16 MED ORDER — IBUPROFEN 600 MG PO TABS
600.0000 mg | ORAL_TABLET | Freq: Four times a day (QID) | ORAL | Status: DC
  Administered 2012-12-16 – 2012-12-17 (×5): 600 mg via ORAL
  Filled 2012-12-16 (×6): qty 1

## 2012-12-16 MED ORDER — MEDROXYPROGESTERONE ACETATE 150 MG/ML IM SUSP: 150.0000 mg | INTRAMUSCULAR | Status: DC | PRN

## 2012-12-16 MED ORDER — OXYCODONE-ACETAMINOPHEN 5-325 MG PO TABS: 1.0000 | ORAL_TABLET | ORAL | Status: DC | PRN

## 2012-12-16 MED ORDER — PRENATAL MULTIVITAMIN CH
1.0000 | ORAL_TABLET | Freq: Every day | ORAL | Status: DC
  Administered 2012-12-16: 1 via ORAL
  Filled 2012-12-16: qty 1

## 2012-12-16 MED ORDER — TETANUS-DIPHTH-ACELL PERTUSSIS 5-2.5-18.5 LF-MCG/0.5 IM SUSP
0.5000 mL | Freq: Once | INTRAMUSCULAR | Status: AC
  Administered 2012-12-16: 0.5 mL via INTRAMUSCULAR
  Filled 2012-12-16: qty 0.5

## 2012-12-16 MED ORDER — WITCH HAZEL-GLYCERIN EX PADS: 1.0000 "application " | MEDICATED_PAD | CUTANEOUS | Status: DC | PRN

## 2012-12-16 MED ORDER — ZOLPIDEM TARTRATE 5 MG PO TABS: 5.0000 mg | ORAL_TABLET | Freq: Every evening | ORAL | Status: DC | PRN

## 2012-12-16 MED ORDER — SIMETHICONE 80 MG PO CHEW: 80.0000 mg | CHEWABLE_TABLET | ORAL | Status: DC | PRN

## 2012-12-16 MED ORDER — ONDANSETRON HCL 4 MG PO TABS: 4.0000 mg | ORAL_TABLET | ORAL | Status: DC | PRN

## 2012-12-16 MED ORDER — PNEUMOCOCCAL VAC POLYVALENT 25 MCG/0.5ML IJ INJ
0.5000 mL | INJECTION | INTRAMUSCULAR | Status: AC
  Administered 2012-12-17: 0.5 mL via INTRAMUSCULAR
  Filled 2012-12-16: qty 0.5

## 2012-12-16 MED ORDER — BENZOCAINE-MENTHOL 20-0.5 % EX AERO: 1.0000 "application " | INHALATION_SPRAY | CUTANEOUS | Status: DC | PRN

## 2012-12-16 MED ORDER — DIBUCAINE 1 % RE OINT: 1.0000 "application " | TOPICAL_OINTMENT | RECTAL | Status: DC | PRN

## 2012-12-16 MED ORDER — MAGNESIUM HYDROXIDE 400 MG/5ML PO SUSP: 30.0000 mL | ORAL | Status: DC | PRN

## 2012-12-16 MED ORDER — METHYLERGONOVINE MALEATE 0.2 MG/ML IJ SOLN: 0.2000 mg | INTRAMUSCULAR | Status: DC | PRN

## 2012-12-16 MED ORDER — ONDANSETRON HCL 4 MG/2ML IJ SOLN: 4.0000 mg | INTRAMUSCULAR | Status: DC | PRN

## 2012-12-16 MED ORDER — LANOLIN HYDROUS EX OINT: TOPICAL_OINTMENT | CUTANEOUS | Status: DC | PRN

## 2012-12-16 MED ORDER — DIPHENHYDRAMINE HCL 25 MG PO CAPS: 25.0000 mg | ORAL_CAPSULE | Freq: Four times a day (QID) | ORAL | Status: DC | PRN

## 2012-12-16 NOTE — Progress Notes (Signed)
Subjective: Pt comfortable s/p epidural around 2130.  Pitocin on 3mu.  S.o. And his mother at bs.   RN rechecked cx after epidural and placed Foley and pt had progressed to 5cm on Pitocin. Objective: BP 115/70  Pulse 74  Temp(Src) 98.6 F (37 C) (Oral)  Resp 18  Ht 5\' 6"  (1.676 m)  Wt 162 lb (73.483 kg)  BMI 26.16 kg/m2  SpO2 100%  LMP 03/28/2012      FHT:  FHR: 125 bpm, variability: moderate,  accelerations:  Present,  decelerations:  Absent UC:   regular, every 2-5 minutes SVE:   Dilation: 6 Effacement (%): 60 Station: -1 Exam by:: H. Maddox Hlavaty Large amt, clear fluid w/ small amt of show Labs: Lab Results  Component Value Date   WBC 8.0 12/15/2012   HGB 11.3* 12/15/2012   HCT 34.5* 12/15/2012   MCV 89.8 12/15/2012   PLT 129* 12/15/2012    Assessment / Plan: 1. [redacted]w[redacted]d 2. GBS neg 3. low platelets on admission 4. hypocoiled cord  Labor: progressing on pitocin Preeclampsia:  no signs or symptoms of toxicity Fetal Wellbeing:  Category I Pain Control:  Epidural I/D:  n/a Anticipated MOD:  NSVD 1. Continue to observe for labor progression; IUPC prn. 2. C/w MD prn  Rether Rison H 12/16/2012, 12:45 AM

## 2012-12-16 NOTE — Anesthesia Postprocedure Evaluation (Signed)
Anesthesia Post Note  Patient: Laurie [name]  Procedure(s) Performed: * No procedures listed *  Anesthesia type: Epidural  Patient location: Mother/Baby  Post pain: Pain level controlled  Post assessment: Post-op Vital signs reviewed  Last Vitals:  Filed Vitals:   12/16/12 0600  BP: 115/64  Pulse: 78  Temp: 36.4 C  Resp: 18    Post vital signs: Reviewed  Level of consciousness: awake  Complications: No apparent anesthesia complications

## 2012-12-16 NOTE — Progress Notes (Signed)
Post Partum Day 0: S/P SVD with no lac  Subjective: Patient up ad lib, denies syncope or dizziness. Feeding:  Breastfeeding Contraceptive plan:   Mirena  Objective: Blood pressure 110/45, pulse 72, temperature 98.8 F (37.1 C), temperature source Oral, resp. rate 18, height 5\' 6"  (1.676 m), weight 162 lb (73.483 kg), last menstrual period 03/28/2012, SpO2 99.00%, unknown if currently breastfeeding.  Physical Exam:  General: alert, cooperative and no distress Lochia: appropriate Uterine Fundus: firm Incision: n/a DVT Evaluation: No evidence of DVT seen on physical exam. Negative Homan's sign.   Recent Labs  12/15/12 1925 12/16/12 0635  HGB 11.3* 11.2*  HCT 34.5* 34.3*    Assessment/Plan: S/P Vaginal delivery day 0 Continue current care Plan for discharge tomorrow   LOS: 1 day   Jayvier Burgher 12/16/2012, 4:52 PM

## 2012-12-17 MED ORDER — IBUPROFEN 600 MG PO TABS: 600.0000 mg | ORAL_TABLET | Freq: Four times a day (QID) | ORAL | Status: DC

## 2012-12-17 NOTE — Discharge Summary (Signed)
  Vaginal Delivery Discharge Summary  Laurie [name]  DOB:    March 23, 1991 MRN:    [id number] CSN:    [id number]  Date of admission:                  12/15/12  Date of discharge:                   12/17/12  Procedures this admission: SVD with no repairs  Date of Delivery: 12/16/12 by French Ana  Newborn Data:  Live born female  Birth Weight: 6 lb 15.5 oz (3160 g) APGAR: 9, 9  Home with mother. Name: "Laurie [name]" Circumcision Plan: Office  History of Present Illness:  Ms. Laurie [name] is a 22 y.o. female, [phone], who presents at [redacted]w[redacted]d weeks gestation. The patient has been followed at the [facility] and Gynecology division of [facility]. She was admitted induction of labor for a BPP of 4/8. Her pregnancy has been complicated by:   Patient Active Problem List   Diagnosis Date Noted  . SVD (spontaneous vaginal delivery) 12/16/2012  . H/O fetal biophysical profile 4/8 12/15/2012  . 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 Pregnancy with history of pre-term labor 07/26/2012  . Pyelectasis of fetus on prenatal ultrasound 07/19/2012  . Asthma 07/16/2012    Hospital course:  The patient was admitted for IOL.   Her labor was not complicated. She proceeded to have a vaginal delivery of a healthy infant. Her delivery was not complicated. Her postpartum course was not complicated.  She was discharged to home on postpartum day 1 doing well.  Feeding:  breast  Contraception:  Mirena  Discharge hemoglobin:  Hemoglobin  Date Value Range Status  12/16/2012 11.2* 12.0 - 15.0 g/dL Final     HCT  Date Value Range Status  12/16/2012 34.3* 36.0 - 46.0 % Final    Discharge Physical Exam:   General: alert, cooperative and no distress Lochia: appropriate Uterine Fundus: firm Incision: n/a DVT Evaluation: No evidence of DVT seen on physical exam. Negative Homan's sign.  Intrapartum Procedures: spontaneous vaginal  delivery Postpartum Procedures: none Complications-Operative and Postpartum: none  Discharge Diagnoses: Term Pregnancy-delivered  Discharge Information:  Activity:           pelvic rest Diet:                routine Medications: PNV and Ibuprofen Condition:      stable Instructions:  refer to practice specific booklet Discharge to: home     Haroldine Laws 12/17/2012

## 2012-12-22 NOTE — Anesthesia Postprocedure Evaluation (Signed)
  Anesthesia Post-op Note  Patient: Laurie [name]  Procedure(s) Performed: * No procedures listed *  Patient Location: PACU  Anesthesia Type: Epidural  Level of Consciousness: awake and alert   Airway and Oxygen Therapy: Patient Spontanous Breathing  Post-op Pain: mild  Post-op Assessment: Post-op Vital signs reviewed, Patient's Cardiovascular Status Stable, Respiratory Function Stable, Patent Airway and No signs of Nausea or vomiting  Last Vitals: There were no vitals filed for this visit.  Post-op Vital Signs: stable   Complications: No apparent anesthesia complications

## 2013-07-14 DIAGNOSIS — A549 Gonococcal infection, unspecified: Secondary | ICD-10-CM

## 2013-07-14 HISTORY — DX: Gonococcal infection, unspecified: A54.9

## 2013-07-21 ENCOUNTER — Observation Stay (HOSPITAL_COMMUNITY)
Admission: EM | Admit: 2013-07-21 | Discharge: 2013-07-22 | Disposition: A | Discharge status: Home / Self Care | Payer: No Typology Code available for payment source | Attending: Surgery | Admitting: Surgery

## 2013-07-21 ENCOUNTER — Emergency Department (HOSPITAL_COMMUNITY): Payer: No Typology Code available for payment source

## 2013-07-21 ENCOUNTER — Encounter (HOSPITAL_COMMUNITY): Payer: Self-pay | Admitting: Emergency Medicine

## 2013-07-21 DIAGNOSIS — A5400 Gonococcal infection of lower genitourinary tract, unspecified: Principal | ICD-10-CM | POA: Insufficient documentation

## 2013-07-21 DIAGNOSIS — R1031 Right lower quadrant pain: Secondary | ICD-10-CM

## 2013-07-21 DIAGNOSIS — A549 Gonococcal infection, unspecified: Secondary | ICD-10-CM

## 2013-07-21 DIAGNOSIS — K389 Disease of appendix, unspecified: Secondary | ICD-10-CM | POA: Insufficient documentation

## 2013-07-21 DIAGNOSIS — J45909 Unspecified asthma, uncomplicated: Secondary | ICD-10-CM | POA: Insufficient documentation

## 2013-07-21 LAB — URINALYSIS, ROUTINE W REFLEX MICROSCOPIC
BILIRUBIN URINE: NEGATIVE
GLUCOSE, UA: NEGATIVE mg/dL
HGB URINE DIPSTICK: NEGATIVE
Ketones, ur: NEGATIVE mg/dL
Nitrite: NEGATIVE
Protein, ur: NEGATIVE mg/dL
SPECIFIC GRAVITY, URINE: 1.027 (ref 1.005–1.030)
UROBILINOGEN UA: 1 mg/dL (ref 0.0–1.0)
pH: 6 (ref 5.0–8.0)

## 2013-07-21 LAB — WET PREP, GENITAL
CLUE CELLS WET PREP: NONE SEEN
TRICH WET PREP: NONE SEEN
YEAST WET PREP: NONE SEEN

## 2013-07-21 LAB — CBC
HEMATOCRIT: 41.2 % (ref 36.0–46.0)
HEMOGLOBIN: 13.4 g/dL (ref 12.0–15.0)
MCH: 29.1 pg (ref 26.0–34.0)
MCHC: 32.5 g/dL (ref 30.0–36.0)
MCV: 89.4 fL (ref 78.0–100.0)
Platelets: 317 10*3/uL (ref 150–400)
RBC: 4.61 MIL/uL (ref 3.87–5.11)
RDW: 14 % (ref 11.5–15.5)
WBC: 7.3 10*3/uL (ref 4.0–10.5)

## 2013-07-21 LAB — COMPREHENSIVE METABOLIC PANEL
ALK PHOS: 75 U/L (ref 39–117)
ALT: 14 U/L (ref 0–35)
AST: 22 U/L (ref 0–37)
Albumin: 4.3 g/dL (ref 3.5–5.2)
BILIRUBIN TOTAL: 0.5 mg/dL (ref 0.3–1.2)
BUN: 10 mg/dL (ref 6–23)
CHLORIDE: 104 meq/L (ref 96–112)
CO2: 21 meq/L (ref 19–32)
CREATININE: 0.56 mg/dL (ref 0.50–1.10)
Calcium: 9.7 mg/dL (ref 8.4–10.5)
GFR calc Af Amer: >90 mL/min (ref >90)
GLUCOSE: 59 mg/dL — AB (ref 70–99)
POTASSIUM: 3.9 meq/L (ref 3.7–5.3)
Sodium: 142 mEq/L (ref 137–147)
Total Protein: 8.6 g/dL — ABNORMAL HIGH (ref 6.0–8.3)

## 2013-07-21 LAB — URINE MICROSCOPIC-ADD ON

## 2013-07-21 LAB — POCT PREGNANCY, URINE: PREG TEST UR: NEGATIVE

## 2013-07-21 MED ORDER — HYDROMORPHONE HCL PF 1 MG/ML IJ SOLN
INTRAMUSCULAR | Status: AC
  Administered 2013-07-21: 1 mg via INTRAVENOUS
  Filled 2013-07-21: qty 1

## 2013-07-21 MED ORDER — IOHEXOL 300 MG/ML  SOLN
25.0000 mL | INTRAMUSCULAR | Status: DC
  Administered 2013-07-21: 25 mL via ORAL

## 2013-07-21 MED ORDER — ALBUTEROL SULFATE (2.5 MG/3ML) 0.083% IN NEBU: 2.5000 mg | INHALATION_SOLUTION | Freq: Four times a day (QID) | RESPIRATORY_TRACT | Status: DC | PRN

## 2013-07-21 MED ORDER — MORPHINE SULFATE 4 MG/ML IJ SOLN
4.0000 mg | Freq: Once | INTRAMUSCULAR | Status: AC
  Administered 2013-07-21: 4 mg via INTRAVENOUS
  Filled 2013-07-21: qty 1

## 2013-07-21 MED ORDER — PIPERACILLIN-TAZOBACTAM 3.375 G IVPB
3.3750 g | Freq: Three times a day (TID) | INTRAVENOUS | Status: DC
  Administered 2013-07-21 – 2013-07-22 (×2): 3.375 g via INTRAVENOUS
  Filled 2013-07-21 (×5): qty 50

## 2013-07-21 MED ORDER — IOHEXOL 300 MG/ML  SOLN
100.0000 mL | Freq: Once | INTRAMUSCULAR | Status: AC | PRN
  Administered 2013-07-21: 100 mL via INTRAVENOUS

## 2013-07-21 MED ORDER — ONDANSETRON HCL 4 MG/2ML IJ SOLN
4.0000 mg | Freq: Four times a day (QID) | INTRAMUSCULAR | Status: DC | PRN
  Filled 2013-07-21 (×2): qty 2

## 2013-07-21 MED ORDER — ALBUTEROL SULFATE HFA 108 (90 BASE) MCG/ACT IN AERS: 2.0000 | INHALATION_SPRAY | Freq: Four times a day (QID) | RESPIRATORY_TRACT | Status: DC | PRN

## 2013-07-21 MED ORDER — HYDROMORPHONE HCL PF 1 MG/ML IJ SOLN
1.0000 mg | INTRAMUSCULAR | Status: DC | PRN
  Administered 2013-07-21 (×2): 1 mg via INTRAVENOUS
  Filled 2013-07-21 (×2): qty 1

## 2013-07-21 MED ORDER — PANTOPRAZOLE SODIUM 40 MG IV SOLR
40.0000 mg | Freq: Every day | INTRAVENOUS | Status: DC
  Administered 2013-07-21: 40 mg via INTRAVENOUS
  Filled 2013-07-21 (×2): qty 40

## 2013-07-21 MED ORDER — POTASSIUM CHLORIDE IN NACL 20-0.9 MEQ/L-% IV SOLN
INTRAVENOUS | Status: DC
  Administered 2013-07-21: 100 mL/h via INTRAVENOUS
  Administered 2013-07-22: 19:00:00 via INTRAVENOUS
  Administered 2013-07-22: 100 mL/h via INTRAVENOUS
  Filled 2013-07-21 (×4): qty 1000

## 2013-07-21 NOTE — ED Provider Notes (Signed)
CSN: [id number]     Arrival date & time 07/21/13  4010 History   First MD Initiated Contact with Patient 07/21/13 [phone]     Chief Complaint  Patient presents with  . Abdominal Pain   (Consider location/radiation/quality/duration/timing/severity/associated sxs/prior Treatment) Patient is a 23 y.o. female presenting with abdominal pain.  Abdominal Pain Pain location:  RLQ Pain quality: sharp   Pain radiates to:  Does not radiate Pain severity:  Moderate Onset quality:  Gradual Duration:  3 days Timing:  Constant Progression:  Worsening Chronicity:  New Relieved by:  Nothing Ineffective treatments:  NSAIDs Associated symptoms: fever and nausea (sometimes)   Associated symptoms: no chest pain, no chills, no constipation, no cough, no diarrhea, no dysuria, no hematemesis, no hematuria, no shortness of breath, no sore throat, no vaginal bleeding, no vaginal discharge and no vomiting     Past Medical History  Diagnosis Date  . Abnormal Pap smear     LAST PAP 09/2010  . Preterm labor 2011  . Anemia     CHILDHOOD  . Headache(784.0)     FREQUENT  . Asthma 2010  . Seizures 2009    WITH MIGRAINE  . SVD (spontaneous vaginal delivery) 12/16/2012   Past Surgical History  Procedure Laterality Date  . No past surgeries     Family History  Problem Relation Age of Onset  . Hypertension Father   . Asthma Father   . Asthma Brother   . Arthritis Paternal Aunt    History  Substance Use Topics  . Smoking status: Passive Smoke Exposure - Never Smoker  . Smokeless tobacco: Never Used  . Alcohol Use: Yes     Comment: OCC   OB History   Grav Para Term Preterm Abortions TAB SAB Ect Mult Living   3 3 2 1  0 0 0 0 0 3     Review of Systems  Constitutional: Positive for fever. Negative for chills.  HENT: Negative for sore throat.   Eyes: Negative for pain.  Respiratory: Negative for cough and shortness of breath.   Cardiovascular: Negative for chest pain.  Gastrointestinal: Positive for  nausea (sometimes) and abdominal pain. Negative for vomiting, diarrhea, constipation and hematemesis.  Genitourinary: Negative for dysuria, hematuria, vaginal bleeding and vaginal discharge.  Musculoskeletal: Negative for back pain.  Skin: Negative for rash.  Neurological: Negative for numbness and headaches.    Allergies  Review of patient's allergies indicates no known allergies.  Home Medications   Current Outpatient Rx  Name  Route  Sig  Dispense  Refill  . albuterol (PROVENTIL HFA;VENTOLIN HFA) 108 (90 BASE) MCG/ACT inhaler   Inhalation   Inhale 2 puffs into the lungs every 6 (six) hours as needed for wheezing.   1 Inhaler   2    BP 107/71  Pulse 74  Temp(Src) 97.3 F (36.3 C) (Oral)  Resp 18  SpO2 98% Physical Exam  Constitutional: She is oriented to person, place, and time. She appears well-developed and well-nourished. No distress.  HENT:  Head: Normocephalic and atraumatic.  Eyes: Pupils are equal, round, and reactive to light. Right eye exhibits no discharge. Left eye exhibits no discharge.  Neck: Normal range of motion.  Cardiovascular: Normal rate, regular rhythm and normal heart sounds.   Pulmonary/Chest: Effort normal and breath sounds normal.  Abdominal: Soft. She exhibits no distension. There is tenderness in the right lower quadrant. There is no rigidity and no guarding. McBurney's point: + Rosving's sign.  Musculoskeletal: Normal range of motion.  Neurological: She is alert and oriented to person, place, and time.  Skin: Skin is warm. She is not diaphoretic.    ED Course  Procedures (including critical care time) Labs Review Labs Reviewed  WET PREP, GENITAL - Abnormal; Notable for the following:    WBC, Wet Prep HPF POC MODERATE (*)    All other components within normal limits  URINALYSIS, ROUTINE W REFLEX MICROSCOPIC - Abnormal; Notable for the following:    APPearance CLOUDY (*)    Leukocytes, UA MODERATE (*)    All other components within normal  limits  COMPREHENSIVE METABOLIC PANEL - Abnormal; Notable for the following:    Glucose, Bld 59 (*)    Total Protein 8.6 (*)    All other components within normal limits  GC/CHLAMYDIA PROBE AMP  CBC  URINE MICROSCOPIC-ADD ON  POCT PREGNANCY, URINE   Imaging Review US Transvaginal Non-ob  07/21/2013   CLINICAL DATA:  Right lower quadrant pain. Last menstrual period 7 days ago.  EXAM: TRANSABDOMINAL AND TRANSVAGINAL ULTRASOUND OF PELVIS  TECHNIQUE: Both transabdominal and transvaginal ultrasound examinations of the pelvis were performed. Transabdominal technique was performed for global imaging of the pelvis including uterus, ovaries, adnexal regions, and pelvic cul-de-sac. It was necessary to proceed with endovaginal exam following the transabdominal exam to visualize the uterus, ovaries, and adnexa.  COMPARISON:  US FETAL BPP W/O NONSTRESS dated 12/15/2012; CT ABD/PELVIS W CM dated 07/21/2013  FINDINGS: Uterus:  9.1 x 4.4 x 5.5 cm. Normal in morphology.  Endometrium:  Normal, 9 mm.  Right Ovary:  3.1 x 1.9 x 2.0 cm. Normal in morphology.  Left Ovary:  3.1 x 1.7 x 1.7 cm. Normal in morphology.  Other Findings:  No significant free fluid.  IMPRESSION: Normal pelvic ultrasound. No explanation for right lower quadrant pain.   Electronically Signed   By: Jeronimo Greaves M.D.   On: 07/21/2013 13:32   US Pelvis Complete  07/21/2013   CLINICAL DATA:  Right lower quadrant pain. Last menstrual period 7 days ago.  EXAM: TRANSABDOMINAL AND TRANSVAGINAL ULTRASOUND OF PELVIS  TECHNIQUE: Both transabdominal and transvaginal ultrasound examinations of the pelvis were performed. Transabdominal technique was performed for global imaging of the pelvis including uterus, ovaries, adnexal regions, and pelvic cul-de-sac. It was necessary to proceed with endovaginal exam following the transabdominal exam to visualize the uterus, ovaries, and adnexa.  COMPARISON:  US FETAL BPP W/O NONSTRESS dated 12/15/2012; CT ABD/PELVIS W CM dated  07/21/2013  FINDINGS: Uterus:  9.1 x 4.4 x 5.5 cm. Normal in morphology.  Endometrium:  Normal, 9 mm.  Right Ovary:  3.1 x 1.9 x 2.0 cm. Normal in morphology.  Left Ovary:  3.1 x 1.7 x 1.7 cm. Normal in morphology.  Other Findings:  No significant free fluid.  IMPRESSION: Normal pelvic ultrasound. No explanation for right lower quadrant pain.   Electronically Signed   By: Jeronimo Greaves M.D.   On: 07/21/2013 13:32   Ct Abdomen Pelvis W Contrast  07/21/2013   CLINICAL DATA:  Abdominal pain  EXAM: CT ABDOMEN AND PELVIS WITH CONTRAST  TECHNIQUE: Multidetector CT imaging of the abdomen and pelvis was performed using the standard protocol following bolus administration of intravenous contrast. The study is somewhat limited due to the incomplete opacification of the bowel and the relative paucity of intra-abdominal fat.  CONTRAST:  80mL OMNIPAQUE IOHEXOL 300 MG/ML  SOLN  COMPARISON:  None.  FINDINGS: The liver exhibits a mildly heterogeneous enhancement pattern. There is no focal mass nor ductal  dilation. The gallbladder is contracted. I cannot exclude tiny layering gallstones demonstrated on images 25 and 26. The pancreas, spleen, adrenal glands, and kidneys are normal in appearance. The caliber of the abdominal aorta is normal.  The stomach is moderately distended with food and oral contrast. Orally administered contrast has traversed much of the normal calibered small bowel but has not yet reached the colon. The stool and gas pattern within the colon is within the limits of normal. The appendix is not discretely identified. No inflammatory changes in the right lower quadrant of the abdomen are demonstrated.  Within the pelvis the uterus demonstrates normal density and contour. There is a complex appearing cystic right adnexal process demonstrated on images 66-75. The left adnexal region demonstrates no suspicious findings. There is no significant free fluid in the cul de sac. The rectum contains stool and gas. There is  no inguinal nor significant umbilical hernia. There is an umbilical ring present.  The lumbar vertebral bodies are preserved in height. The bony pelvis exhibits no acute abnormality. The lung bases are clear.  IMPRESSION: 1. There is no evidence of bowel obstruction or acute inflammation. The study is limited in that oral contrast has not reached the colon. 2. I cannot exclude punctate gallstones but there is no evidence of gallbladder distention or wall thickening. The remainder of the hepatobiliary tree appears normal. 3. There is no acute abnormality of the kidneys or the urinary bladder. 4. There is poor definition of the right adnexal structures were complex appearing hypodensity is present. This is structure may measure up to 3 cm in diameter. Further evaluation with pelvic ultrasound would be of value 5. There is no intra abdominal nor pelvic lymphadenopathy nor evidence of an inflammatory mass. The lung bases are clear.   Electronically Signed   By: David  [place]   On: 07/21/2013 13:23   EKG Interpretation   None      MDM   1. RLQ abdominal pain     Laurie [name] is a 23 y.o. female who presents to the ED with abdominal pain, nausea x3 days, worsening.   Given location and PE, concern for appendicitis. Pelvic performed to further localize pain, no significant adnexal tenderness. Will perform CT abd/pev + US pelvis to further characterize.   CT demonstrates "cystic right adnexal process," though US demonstrates normal findings. Given patient's RLQ pain, still conern for appendicitis. Will consult surgery.   Surgery evaluated. Will admit for observation, possible to OR for appy. Anticipate admit. HDS in ED. Patient seen and evaluated by myself and my attending, Dr. Micheline Mazeocherty.       Imagene ShellerSteve Berklie Dethlefs, MD 07/21/13 1606

## 2013-07-21 NOTE — H&P (Addendum)
Laurie [name] is an 23 y.o. female.   Chief Complaint: RLQ pain x 4 days HPI: Healthy 23 yo female presents with 4 day history of worsening RLQ abdominal pain.  No fever, mild nausea, no vomiting.  Normal BM.  Normal appetite.  She underwent work-up in ED.  CT scan showed right adnexal mass, but pelvic ultrasound was normal.  She had regular food while waiting in the ED waiting area about 3 hours ago.  Pain not alleviated without narcotic pain medication.  Past Medical History  Diagnosis Date  . Abnormal Pap smear     LAST PAP 09/2010  . Preterm labor 2011  . Anemia     CHILDHOOD  . Headache(784.0)     FREQUENT  . Asthma 2010  . Seizures 2009    WITH MIGRAINE  . SVD (spontaneous vaginal delivery) 12/16/2012    Past Surgical History  Procedure Laterality Date  . No past surgeries      Family History  Problem Relation Age of Onset  . Hypertension Father   . Asthma Father   . Asthma Brother   . Arthritis Paternal Aunt    Social History:  reports that she has been passively smoking.  She has never used smokeless tobacco. She reports that she drinks alcohol. She reports that she does not use illicit drugs.  Allergies: No Known Allergies  Prior to Admission medications   Medication Sig Start Date End Date Taking? Authorizing Provider  albuterol (PROVENTIL HFA;VENTOLIN HFA) 108 (90 BASE) MCG/ACT inhaler Inhale 2 puffs into the lungs every 6 (six) hours as needed for wheezing. 07/16/12  Yes Donnel Saxon, CNM     Results for orders placed during the hospital encounter of 07/21/13 (from the past 48 hour(s))  URINALYSIS, ROUTINE W REFLEX MICROSCOPIC     Status: Abnormal   Collection Time    07/21/13 10:02 AM      Result Value Range   Color, Urine YELLOW  YELLOW   APPearance CLOUDY (*) CLEAR   Specific Gravity, Urine 1.027  1.005 - 1.030   pH 6.0  5.0 - 8.0   Glucose, UA NEGATIVE  NEGATIVE mg/dL   Hgb urine dipstick NEGATIVE  NEGATIVE   Bilirubin Urine NEGATIVE  NEGATIVE    Ketones, ur NEGATIVE  NEGATIVE mg/dL   Protein, ur NEGATIVE  NEGATIVE mg/dL   Urobilinogen, UA 1.0  0.0 - 1.0 mg/dL   Nitrite NEGATIVE  NEGATIVE   Leukocytes, UA MODERATE (*) NEGATIVE  URINE MICROSCOPIC-ADD ON     Status: None   Collection Time    07/21/13 10:02 AM      Result Value Range   Squamous Epithelial / LPF RARE  RARE   WBC, UA 7-10  <3 WBC/hpf   Bacteria, UA RARE  RARE   Urine-Other MUCOUS PRESENT    POCT PREGNANCY, URINE     Status: None   Collection Time    07/21/13 10:07 AM      Result Value Range   Preg Test, Ur NEGATIVE  NEGATIVE   Comment:            THE SENSITIVITY OF THIS     METHODOLOGY IS >24 mIU/mL  CBC     Status: None   Collection Time    07/21/13 11:14 AM      Result Value Range   WBC 7.3  4.0 - 10.5 K/uL   RBC 4.61  3.87 - 5.11 MIL/uL   Hemoglobin 13.4  12.0 - 15.0 g/dL   HCT  41.2  36.0 - 46.0 %   MCV 89.4  78.0 - 100.0 fL   MCH 29.1  26.0 - 34.0 pg   MCHC 32.5  30.0 - 36.0 g/dL   RDW 14.0  11.5 - 15.5 %   Platelets 317  150 - 400 K/uL  COMPREHENSIVE METABOLIC PANEL     Status: Abnormal   Collection Time    07/21/13 11:14 AM      Result Value Range   Sodium 142  137 - 147 mEq/L   Potassium 3.9  3.7 - 5.3 mEq/L   Chloride 104  96 - 112 mEq/L   CO2 21  19 - 32 mEq/L   Glucose, Bld 59 (*) 70 - 99 mg/dL   BUN 10  6 - 23 mg/dL   Creatinine, Ser 0.56  0.50 - 1.10 mg/dL   Calcium 9.7  8.4 - 10.5 mg/dL   Total Protein 8.6 (*) 6.0 - 8.3 g/dL   Albumin 4.3  3.5 - 5.2 g/dL   AST 22  0 - 37 U/L   ALT 14  0 - 35 U/L   Alkaline Phosphatase 75  39 - 117 U/L   Total Bilirubin 0.5  0.3 - 1.2 mg/dL   GFR calc non Af Amer >90  >90 mL/min   GFR calc Af Amer >90  >90 mL/min   Comment: (NOTE)     The eGFR has been calculated using the CKD EPI equation.     This calculation has not been validated in all clinical situations.     eGFR's persistently <90 mL/min signify possible Chronic Kidney     Disease.  WET PREP, GENITAL     Status: Abnormal    Collection Time    07/21/13 11:42 AM      Result Value Range   Yeast Wet Prep HPF POC NONE SEEN  NONE SEEN   Trich, Wet Prep NONE SEEN  NONE SEEN   Clue Cells Wet Prep HPF POC NONE SEEN  NONE SEEN   WBC, Wet Prep HPF POC MODERATE (*) NONE SEEN   US Transvaginal Non-ob  07/21/2013   CLINICAL DATA:  Right lower quadrant pain. Last menstrual period 7 days ago.  EXAM: TRANSABDOMINAL AND TRANSVAGINAL ULTRASOUND OF PELVIS  TECHNIQUE: Both transabdominal and transvaginal ultrasound examinations of the pelvis were performed. Transabdominal technique was performed for global imaging of the pelvis including uterus, ovaries, adnexal regions, and pelvic cul-de-sac. It was necessary to proceed with endovaginal exam following the transabdominal exam to visualize the uterus, ovaries, and adnexa.  COMPARISON:  US FETAL BPP W/O NONSTRESS dated 12/15/2012; CT ABD/PELVIS W CM dated 07/21/2013  FINDINGS: Uterus:  9.1 x 4.4 x 5.5 cm. Normal in morphology.  Endometrium:  Normal, 9 mm.  Right Ovary:  3.1 x 1.9 x 2.0 cm. Normal in morphology.  Left Ovary:  3.1 x 1.7 x 1.7 cm. Normal in morphology.  Other Findings:  No significant free fluid.  IMPRESSION: Normal pelvic ultrasound. No explanation for right lower quadrant pain.   Electronically Signed   By: Abigail Miyamoto M.D.   On: 07/21/2013 13:32   US Pelvis Complete  07/21/2013   CLINICAL DATA:  Right lower quadrant pain. Last menstrual period 7 days ago.  EXAM: TRANSABDOMINAL AND TRANSVAGINAL ULTRASOUND OF PELVIS  TECHNIQUE: Both transabdominal and transvaginal ultrasound examinations of the pelvis were performed. Transabdominal technique was performed for global imaging of the pelvis including uterus, ovaries, adnexal regions, and pelvic cul-de-sac. It was necessary to proceed with endovaginal  exam following the transabdominal exam to visualize the uterus, ovaries, and adnexa.  COMPARISON:  US FETAL BPP W/O NONSTRESS dated 12/15/2012; CT ABD/PELVIS W CM dated 07/21/2013  FINDINGS:  Uterus:  9.1 x 4.4 x 5.5 cm. Normal in morphology.  Endometrium:  Normal, 9 mm.  Right Ovary:  3.1 x 1.9 x 2.0 cm. Normal in morphology.  Left Ovary:  3.1 x 1.7 x 1.7 cm. Normal in morphology.  Other Findings:  No significant free fluid.  IMPRESSION: Normal pelvic ultrasound. No explanation for right lower quadrant pain.   Electronically Signed   By: Abigail Miyamoto M.D.   On: 07/21/2013 13:32   Ct Abdomen Pelvis W Contrast  07/21/2013   CLINICAL DATA:  Abdominal pain  EXAM: CT ABDOMEN AND PELVIS WITH CONTRAST  TECHNIQUE: Multidetector CT imaging of the abdomen and pelvis was performed using the standard protocol following bolus administration of intravenous contrast. The study is somewhat limited due to the incomplete opacification of the bowel and the relative paucity of intra-abdominal fat.  CONTRAST:  19m OMNIPAQUE IOHEXOL 300 MG/ML  SOLN  COMPARISON:  None.  FINDINGS: The liver exhibits a mildly heterogeneous enhancement pattern. There is no focal mass nor ductal dilation. The gallbladder is contracted. I cannot exclude tiny layering gallstones demonstrated on images 25 and 26. The pancreas, spleen, adrenal glands, and kidneys are normal in appearance. The caliber of the abdominal aorta is normal.  The stomach is moderately distended with food and oral contrast. Orally administered contrast has traversed much of the normal calibered small bowel but has not yet reached the colon. The stool and gas pattern within the colon is within the limits of normal. The appendix is not discretely identified. No inflammatory changes in the right lower quadrant of the abdomen are demonstrated.  Within the pelvis the uterus demonstrates normal density and contour. There is a complex appearing cystic right adnexal process demonstrated on images 66-75. The left adnexal region demonstrates no suspicious findings. There is no significant free fluid in the cul de sac. The rectum contains stool and gas. There is no inguinal nor  significant umbilical hernia. There is an umbilical ring present.  The lumbar vertebral bodies are preserved in height. The bony pelvis exhibits no acute abnormality. The lung bases are clear.  IMPRESSION: 1. There is no evidence of bowel obstruction or acute inflammation. The study is limited in that oral contrast has not reached the colon. 2. I cannot exclude punctate gallstones but there is no evidence of gallbladder distention or wall thickening. The remainder of the hepatobiliary tree appears normal. 3. There is no acute abnormality of the kidneys or the urinary bladder. 4. There is poor definition of the right adnexal structures were complex appearing hypodensity is present. This is structure may measure up to 3 cm in diameter. Further evaluation with pelvic ultrasound would be of value 5. There is no intra abdominal nor pelvic lymphadenopathy nor evidence of an inflammatory mass. The lung bases are clear.   Electronically Signed   By: David  JMartinique  On: 07/21/2013 13:23    Review of Systems  Constitutional: Negative for weight loss.  HENT: Negative for ear discharge, ear pain, hearing loss and tinnitus.   Eyes: Negative.  Negative for blurred vision, double vision, photophobia and pain.  Respiratory: Negative for cough, sputum production and shortness of breath.   Cardiovascular: Negative for chest pain.  Gastrointestinal: Positive for nausea and abdominal pain. Negative for vomiting.  Genitourinary: Negative for dysuria, urgency, frequency  and flank pain.  Musculoskeletal: Negative for back pain, falls, joint pain, myalgias and neck pain.  Neurological: Negative.  Negative for dizziness, tingling, sensory change, focal weakness, loss of consciousness and headaches.  Endo/Heme/Allergies: Negative.  Does not bruise/bleed easily.  Psychiatric/Behavioral: Negative for depression, memory loss and substance abuse. The patient is not nervous/anxious.     Blood pressure 107/71, pulse 74,  temperature 97.3 F (36.3 C), temperature source Oral, resp. rate 18, SpO2 98.00%. Physical Exam  WDWN in NAD HEENT:  EOMI, sclera anicteric Neck:  No masses, no thyromegaly Lungs:  CTA bilaterally; normal respiratory effort CV:  Regular rate and rhythm; no murmurs Abd:  +bowel sounds, soft, tender RLQ below McBurney's point Ext:  Well-perfused; no edema Skin:  Warm, dry; no sign of jaundice  Assessment/Plan RLQ abdominal pain - CT findings of complex right adnexal process, but pelvic ultrasound shows normal right ovary.  Appendix not identified on CT scan.    Will admit the patient to the hospital for IV abx for empiric treatment of right pelvic inflammatory mass.  If examination not improved tomorrow, will proceed with diagnostic laparoscopy and appendectomy.  Since she just ate a regular meal a few hours ago, would not rush to operate tonight.  Jumaane Weatherford K. 07/21/2013, 3:30 PM

## 2013-07-21 NOTE — ED Notes (Signed)
Rt side abd pain x 3 days  Denies dysuria  Or  Abnormal vag d/c some nausea  LMP  1/115

## 2013-07-21 NOTE — ED Notes (Signed)
C/o right lower quad pain, started 2 days ago-- sm amt vag d/c-- "normal" per patient

## 2013-07-22 ENCOUNTER — Encounter (HOSPITAL_COMMUNITY)
Admission: EM | Disposition: A | Discharge status: Home / Self Care | Payer: Self-pay | Source: Home / Self Care | Attending: Emergency Medicine

## 2013-07-22 ENCOUNTER — Encounter (HOSPITAL_COMMUNITY): Payer: No Typology Code available for payment source | Admitting: Certified Registered Nurse Anesthetist

## 2013-07-22 ENCOUNTER — Observation Stay (HOSPITAL_COMMUNITY): Payer: No Typology Code available for payment source | Admitting: Certified Registered Nurse Anesthetist

## 2013-07-22 ENCOUNTER — Encounter (HOSPITAL_COMMUNITY): Payer: Self-pay | Admitting: Anesthesiology

## 2013-07-22 DIAGNOSIS — A549 Gonococcal infection, unspecified: Secondary | ICD-10-CM

## 2013-07-22 HISTORY — PX: LAPAROSCOPIC APPENDECTOMY: SHX408

## 2013-07-22 HISTORY — PX: LAPAROSCOPY: SHX197

## 2013-07-22 LAB — CREATININE, SERUM
Creatinine, Ser: 0.63 mg/dL (ref 0.50–1.10)
GFR calc Af Amer: >90 mL/min (ref >90)
GFR calc non Af Amer: >90 mL/min (ref >90)

## 2013-07-22 LAB — BASIC METABOLIC PANEL
BUN: 6 mg/dL (ref 6–23)
CALCIUM: 8.9 mg/dL (ref 8.4–10.5)
CO2: 21 mEq/L (ref 19–32)
Chloride: 103 mEq/L (ref 96–112)
Creatinine, Ser: 0.65 mg/dL (ref 0.50–1.10)
GFR calc Af Amer: >90 mL/min (ref >90)
Glucose, Bld: 84 mg/dL (ref 70–99)
POTASSIUM: 4.2 meq/L (ref 3.7–5.3)
Sodium: 137 mEq/L (ref 137–147)

## 2013-07-22 LAB — CBC
HCT: 34.4 % — ABNORMAL LOW (ref 36.0–46.0)
HCT: 33.6 % — ABNORMAL LOW (ref 36.0–46.0)
HEMOGLOBIN: 11.2 g/dL — AB (ref 12.0–15.0)
HEMOGLOBIN: 11 g/dL — AB (ref 12.0–15.0)
MCH: 29.1 pg (ref 26.0–34.0)
MCH: 29.2 pg (ref 26.0–34.0)
MCHC: 32.6 g/dL (ref 30.0–36.0)
MCHC: 32.7 g/dL (ref 30.0–36.0)
MCV: 89.4 fL (ref 78.0–100.0)
MCV: 89.1 fL (ref 78.0–100.0)
PLATELETS: 286 10*3/uL (ref 150–400)
Platelets: 233 10*3/uL (ref 150–400)
RBC: 3.85 MIL/uL — ABNORMAL LOW (ref 3.87–5.11)
RBC: 3.77 MIL/uL — AB (ref 3.87–5.11)
RDW: 14 % (ref 11.5–15.5)
RDW: 14.1 % (ref 11.5–15.5)
WBC: 10.3 10*3/uL (ref 4.0–10.5)
WBC: 8.6 10*3/uL (ref 4.0–10.5)

## 2013-07-22 LAB — GC/CHLAMYDIA PROBE AMP
CT PROBE, AMP APTIMA: POSITIVE — AB
GC Probe RNA: POSITIVE — AB

## 2013-07-22 SURGERY — APPENDECTOMY, LAPAROSCOPIC
Anesthesia: General | Site: Abdomen

## 2013-07-22 MED ORDER — ENOXAPARIN SODIUM 40 MG/0.4ML ~~LOC~~ SOLN: 40.0000 mg | SUBCUTANEOUS | Status: DC

## 2013-07-22 MED ORDER — SODIUM CHLORIDE 0.9 % IR SOLN
Status: DC | PRN
  Administered 2013-07-22: 1000 mL

## 2013-07-22 MED ORDER — HYDROMORPHONE HCL PF 1 MG/ML IJ SOLN
INTRAMUSCULAR | Status: AC
  Filled 2013-07-22: qty 1

## 2013-07-22 MED ORDER — ONDANSETRON HCL 4 MG/2ML IJ SOLN
4.0000 mg | Freq: Four times a day (QID) | INTRAMUSCULAR | Status: DC | PRN
  Administered 2013-07-22: 4 mg via INTRAVENOUS
  Filled 2013-07-22: qty 2

## 2013-07-22 MED ORDER — NEOSTIGMINE METHYLSULFATE 1 MG/ML IJ SOLN
INTRAMUSCULAR | Status: DC | PRN
  Administered 2013-07-22: 3 mg via INTRAVENOUS

## 2013-07-22 MED ORDER — METOCLOPRAMIDE HCL 5 MG/ML IJ SOLN: 10.0000 mg | Freq: Once | INTRAMUSCULAR | Status: DC | PRN

## 2013-07-22 MED ORDER — OXYCODONE-ACETAMINOPHEN 5-325 MG PO TABS
1.0000 | ORAL_TABLET | ORAL | Status: DC | PRN
  Administered 2013-07-22: 1 via ORAL
  Filled 2013-07-22: qty 1

## 2013-07-22 MED ORDER — OXYCODONE HCL 5 MG/5ML PO SOLN: 5.0000 mg | Freq: Once | ORAL | Status: DC | PRN

## 2013-07-22 MED ORDER — 0.9 % SODIUM CHLORIDE (POUR BTL) OPTIME
TOPICAL | Status: DC | PRN
  Administered 2013-07-22: 1000 mL

## 2013-07-22 MED ORDER — FENTANYL CITRATE 0.05 MG/ML IJ SOLN
INTRAMUSCULAR | Status: DC | PRN
  Administered 2013-07-22: 50 ug via INTRAVENOUS
  Administered 2013-07-22: 100 ug via INTRAVENOUS

## 2013-07-22 MED ORDER — KETOROLAC TROMETHAMINE 30 MG/ML IJ SOLN
30.0000 mg | Freq: Four times a day (QID) | INTRAMUSCULAR | Status: DC
Start: 2013-07-22 — End: 2013-07-23
  Administered 2013-07-22: 30 mg via INTRAVENOUS
  Filled 2013-07-22: qty 1

## 2013-07-22 MED ORDER — LACTATED RINGERS IV SOLN
INTRAVENOUS | Status: DC | PRN
  Administered 2013-07-22: 10:00:00 via INTRAVENOUS

## 2013-07-22 MED ORDER — CEFTRIAXONE SODIUM 250 MG IJ SOLR
250.0000 mg | INTRAMUSCULAR | Status: DC
  Administered 2013-07-22: 250 mg via INTRAMUSCULAR
  Filled 2013-07-22: qty 250

## 2013-07-22 MED ORDER — MIDAZOLAM HCL 5 MG/5ML IJ SOLN
INTRAMUSCULAR | Status: DC | PRN
  Administered 2013-07-22: 2 mg via INTRAVENOUS

## 2013-07-22 MED ORDER — ONDANSETRON HCL 4 MG PO TABS: 4.0000 mg | ORAL_TABLET | Freq: Four times a day (QID) | ORAL | Status: DC | PRN

## 2013-07-22 MED ORDER — ONDANSETRON HCL 4 MG/2ML IJ SOLN
INTRAMUSCULAR | Status: DC | PRN
  Administered 2013-07-22: 4 mg via INTRAVENOUS

## 2013-07-22 MED ORDER — OXYCODONE-ACETAMINOPHEN 5-325 MG PO TABS: 1.0000 | ORAL_TABLET | Freq: Four times a day (QID) | ORAL | Status: DC | PRN

## 2013-07-22 MED ORDER — AZITHROMYCIN 500 MG PO TABS
500.0000 mg | ORAL_TABLET | ORAL | Status: DC
  Administered 2013-07-22: 500 mg via ORAL
  Filled 2013-07-22: qty 1

## 2013-07-22 MED ORDER — SUCCINYLCHOLINE CHLORIDE 20 MG/ML IJ SOLN
INTRAMUSCULAR | Status: DC | PRN
  Administered 2013-07-22: 80 mg via INTRAVENOUS

## 2013-07-22 MED ORDER — AZITHROMYCIN 500 MG PO TABS
1000.0000 mg | ORAL_TABLET | ORAL | Status: DC
  Filled 2013-07-22: qty 2

## 2013-07-22 MED ORDER — ROCURONIUM BROMIDE 100 MG/10ML IV SOLN
INTRAVENOUS | Status: DC | PRN
  Administered 2013-07-22: 20 mg via INTRAVENOUS

## 2013-07-22 MED ORDER — OXYCODONE HCL 5 MG PO TABS: 5.0000 mg | ORAL_TABLET | Freq: Once | ORAL | Status: DC | PRN

## 2013-07-22 MED ORDER — BUPIVACAINE-EPINEPHRINE 0.25% -1:200000 IJ SOLN
INTRAMUSCULAR | Status: DC | PRN
  Administered 2013-07-22: 8 mL

## 2013-07-22 MED ORDER — GLYCOPYRROLATE 0.2 MG/ML IJ SOLN
INTRAMUSCULAR | Status: DC | PRN
  Administered 2013-07-22: 0.4 mg via INTRAVENOUS

## 2013-07-22 MED ORDER — ACETAMINOPHEN 325 MG PO TABS: 650.0000 mg | ORAL_TABLET | ORAL | Status: DC | PRN

## 2013-07-22 MED ORDER — LIDOCAINE HCL (CARDIAC) 20 MG/ML IV SOLN
INTRAVENOUS | Status: DC | PRN
  Administered 2013-07-22: 45 mg via INTRAVENOUS

## 2013-07-22 MED ORDER — PROPOFOL 10 MG/ML IV BOLUS
INTRAVENOUS | Status: DC | PRN
  Administered 2013-07-22: 160 mg via INTRAVENOUS

## 2013-07-22 MED ORDER — HYDROMORPHONE HCL PF 1 MG/ML IJ SOLN
0.2500 mg | INTRAMUSCULAR | Status: DC | PRN
Start: 2013-07-22 — End: 2013-07-22
  Administered 2013-07-22 (×2): 0.25 mg via INTRAVENOUS

## 2013-07-22 SURGICAL SUPPLY — 47 items
APPLIER CLIP ROT 10 11.4 M/L (STAPLE)
BENZOIN TINCTURE PRP APPL 2/3 (GAUZE/BANDAGES/DRESSINGS) ×3 IMPLANT
BLADE SURG ROTATE 9660 (MISCELLANEOUS) IMPLANT
CANISTER SUCTION 2500CC (MISCELLANEOUS) ×3 IMPLANT
CHLORAPREP W/TINT 26ML (MISCELLANEOUS) ×3 IMPLANT
CLIP APPLIE ROT 10 11.4 M/L (STAPLE) IMPLANT
CLOSURE WOUND 1/2 X4 (GAUZE/BANDAGES/DRESSINGS) ×1
COVER SURGICAL LIGHT HANDLE (MISCELLANEOUS) ×3 IMPLANT
CUTTER FLEX LINEAR 45M (STAPLE) ×3 IMPLANT
DECANTER SPIKE VIAL GLASS SM (MISCELLANEOUS) ×3 IMPLANT
DRAPE UTILITY 15X26 W/TAPE STR (DRAPE) ×6 IMPLANT
DRSG TEGADERM 2-3/8X2-3/4 SM (GAUZE/BANDAGES/DRESSINGS) ×6 IMPLANT
DRSG TEGADERM 4X4.75 (GAUZE/BANDAGES/DRESSINGS) ×3 IMPLANT
ELECT REM PT RETURN 9FT ADLT (ELECTROSURGICAL) ×3
ELECTRODE REM PT RTRN 9FT ADLT (ELECTROSURGICAL) ×1 IMPLANT
ENDOLOOP SUT PDS II  0 18 (SUTURE)
ENDOLOOP SUT PDS II 0 18 (SUTURE) IMPLANT
FILTER SMOKE EVAC LAPAROSHD (FILTER) ×3 IMPLANT
GAUZE SPONGE 2X2 8PLY STRL LF (GAUZE/BANDAGES/DRESSINGS) ×1 IMPLANT
GLOVE BIO SURGEON STRL SZ7 (GLOVE) ×6 IMPLANT
GLOVE BIOGEL PI IND STRL 6.5 (GLOVE) ×2 IMPLANT
GLOVE BIOGEL PI IND STRL 7.0 (GLOVE) ×2 IMPLANT
GLOVE BIOGEL PI IND STRL 7.5 (GLOVE) ×2 IMPLANT
GLOVE BIOGEL PI INDICATOR 6.5 (GLOVE) ×4
GLOVE BIOGEL PI INDICATOR 7.0 (GLOVE) ×4
GLOVE BIOGEL PI INDICATOR 7.5 (GLOVE) ×4
GLOVE SURG SS PI 7.0 STRL IVOR (GLOVE) ×6 IMPLANT
GOWN STRL NON-REIN LRG LVL3 (GOWN DISPOSABLE) ×12 IMPLANT
KIT BASIN OR (CUSTOM PROCEDURE TRAY) ×3 IMPLANT
KIT ROOM TURNOVER OR (KITS) ×3 IMPLANT
NS IRRIG 1000ML POUR BTL (IV SOLUTION) ×3 IMPLANT
PAD ARMBOARD 7.5X6 YLW CONV (MISCELLANEOUS) ×6 IMPLANT
POUCH SPECIMEN RETRIEVAL 10MM (ENDOMECHANICALS) ×3 IMPLANT
RELOAD STAPLE TA45 3.5 REG BLU (ENDOMECHANICALS) ×3 IMPLANT
SCALPEL HARMONIC ACE (MISCELLANEOUS) ×3 IMPLANT
SET IRRIG TUBING LAPAROSCOPIC (IRRIGATION / IRRIGATOR) ×3 IMPLANT
SPECIMEN JAR SMALL (MISCELLANEOUS) ×3 IMPLANT
SPONGE GAUZE 2X2 STER 10/PKG (GAUZE/BANDAGES/DRESSINGS) ×2
STRIP CLOSURE SKIN 1/2X4 (GAUZE/BANDAGES/DRESSINGS) ×2 IMPLANT
SUT MNCRL AB 4-0 PS2 18 (SUTURE) ×3 IMPLANT
TOWEL OR 17X24 6PK STRL BLUE (TOWEL DISPOSABLE) IMPLANT
TOWEL OR 17X26 10 PK STRL BLUE (TOWEL DISPOSABLE) ×3 IMPLANT
TRAY FOLEY CATH 16FR SILVER (SET/KITS/TRAYS/PACK) ×3 IMPLANT
TRAY LAPAROSCOPIC (CUSTOM PROCEDURE TRAY) ×3 IMPLANT
TROCAR XCEL BLADELESS 5X75MML (TROCAR) ×6 IMPLANT
TROCAR XCEL BLUNT TIP 100MML (ENDOMECHANICALS) ×3 IMPLANT
WATER STERILE IRR 1000ML POUR (IV SOLUTION) IMPLANT

## 2013-07-22 NOTE — Progress Notes (Signed)
UR completed 

## 2013-07-22 NOTE — Preoperative (Signed)
Beta Blockers   Reason not to administer Beta Blockers:Not Applicable 

## 2013-07-22 NOTE — Anesthesia Preprocedure Evaluation (Signed)
Anesthesia Evaluation  Patient identified by MRN, date of birth, ID band Patient awake    Reviewed: Allergy & Precautions, H&P , NPO status , Patient's Chart, lab work & pertinent test results, reviewed documented beta blocker date and time   Airway Mallampati: II TM Distance: >3 FB Neck ROM: full    Dental   Pulmonary asthma ,  breath sounds clear to auscultation        Cardiovascular negative cardio ROS  Rhythm:regular     Neuro/Psych  Headaches, Seizures -,  negative psych ROS   GI/Hepatic negative GI ROS, Neg liver ROS,   Endo/Other  negative endocrine ROS  Renal/GU negative Renal ROS  negative genitourinary   Musculoskeletal   Abdominal   Peds  Hematology negative hematology ROS (+)   Anesthesia Other Findings See surgeon's H&P   Reproductive/Obstetrics negative OB ROS                           Anesthesia Physical Anesthesia Plan  ASA: II and emergent  Anesthesia Plan: General   Post-op Pain Management:    Induction: Intravenous  Airway Management Planned: Oral ETT  Additional Equipment:   Intra-op Plan:   Post-operative Plan: Extubation in OR  Informed Consent: I have reviewed the patients History and Physical, chart, labs and discussed the procedure including the risks, benefits and alternatives for the proposed anesthesia with the patient or authorized representative who has indicated his/her understanding and acceptance.   Dental Advisory Given  Plan Discussed with: CRNA and Surgeon  Anesthesia Plan Comments:         Anesthesia Quick Evaluation

## 2013-07-22 NOTE — Discharge Instructions (Signed)
LAPAROSCOPIC SURGERY: POST OP INSTRUCTIONS ° °1. DIET: Follow a light bland diet the first 24 hours after arrival home, such as soup, liquids, crackers, etc.  Be sure to include lots of fluids daily.  Avoid fast food or heavy meals as your are more likely to get nauseated.  Eat a low fat the next few days after surgery.   °2. Take your usually prescribed home medications unless otherwise directed. °3. PAIN CONTROL: °a. Pain is best controlled by a usual combination of three different methods TOGETHER: °i. Ice/Heat °ii. Over the counter pain medication °iii. Prescription pain medication °b. Most patients will experience some swelling and bruising around the incisions.  Ice packs or heating pads (30-60 minutes up to 6 times a day) will help. Use ice for the first few days to help decrease swelling and bruising, then switch to heat to help relax tight/sore spots and speed recovery.  Some people prefer to use ice alone, heat alone, alternating between ice & heat.  Experiment to what works for you.  Swelling and bruising can take several weeks to resolve.   °c. It is helpful to take an over-the-counter pain medication regularly for the first few weeks.  Choose one of the following that works best for you: °i. Naproxen (Aleve, etc)  Two 220mg tabs twice a day °ii. Ibuprofen (Advil, etc) Three 200mg tabs four times a day (every meal & bedtime) °iii. Acetaminophen (Tylenol, etc) 500-650mg four times a day (every meal & bedtime) °d. A  prescription for pain medication (such as oxycodone, hydrocodone, etc) should be given to you upon discharge.  Take your pain medication as prescribed.  °i. If you are having problems/concerns with the prescription medicine (does not control pain, nausea, vomiting, rash, itching, etc), please call us [phone] to see if we need to switch you to a different pain medicine that will work better for you and/or control your side effect better. °ii. If you need a refill on your pain medication,  please contact your pharmacy.  They will contact our office to request authorization. Prescriptions will not be filled after 5 pm or on week-ends. °4. Avoid getting constipated.  Between the surgery and the pain medications, it is common to experience some constipation.  Increasing fluid intake and taking a fiber supplement (such as Metamucil, Citrucel, FiberCon, MiraLax, etc) 1-2 times a day regularly will usually help prevent this problem from occurring.  A mild laxative (prune juice, Milk of Magnesia, MiraLax, etc) should be taken according to package directions if there are no bowel movements after 48 hours.   °5. Watch out for diarrhea.  If you have many loose bowel movements, simplify your diet to bland foods & liquids for a few days.  Stop any stool softeners and decrease your fiber supplement.  Switching to mild anti-diarrheal medications (Kayopectate, Pepto Bismol) can help.  If this worsens or does not improve, please call us. °6. Wash / shower every day.  You may shower over the dressings as they are waterproof.  Continue to shower over incision(s) after the dressing is off. °7. Remove your waterproof bandages 5 days after surgery.  You may leave the incision open to air.  You may replace a dressing/Band-Aid to cover the incision for comfort if you wish.  °8. ACTIVITIES as tolerated:   °a. You may resume regular (light) daily activities beginning the next day--such as daily self-care, walking, climbing stairs--gradually increasing activities as tolerated.  If you can walk 30 minutes without difficulty, it   is safe to try more intense activity such as jogging, treadmill, bicycling, low-impact aerobics, swimming, etc. b. Save the most intensive and strenuous activity for last such as sit-ups, heavy lifting, contact sports, etc  Refrain from any heavy lifting or straining until you are off narcotics for pain control.   c. DO NOT PUSH THROUGH PAIN.  Let pain be your guide: If it hurts to do something, don't  do it.  Pain is your body warning you to avoid that activity for another week until the pain goes down. d. You may drive when you are no longer taking prescription pain medication, you can comfortably wear a seatbelt, and you can safely maneuver your car and apply brakes. e. Bonita QuinYou may have sexual intercourse when it is comfortable.  9. FOLLOW UP in our office a. Please call [facility] at [phone] to set up an appointment to see your surgeon in the office for a follow-up appointment approximately 2-3 weeks after your surgery. b. Make sure that you call for this appointment the day you arrive home to insure a convenient appointment time. 10. IF YOU HAVE DISABILITY OR FAMILY LEAVE FORMS, BRING THEM TO THE OFFICE FOR PROCESSING.  DO NOT GIVE THEM TO YOUR DOCTOR.   WHEN TO CALL US [phone]) [phone]: 1. Poor pain control 2. Reactions / problems with new medications (rash/itching, nausea, etc)  3. Fever over 101.5 F (38.5 C) 4. Inability to urinate 5. Nausea and/or vomiting 6. Worsening swelling or bruising 7. Continued bleeding from incision. 8. Increased pain, redness, or drainage from the incision   The clinic staff is available to answer your questions during regular business hours (8:30am-5pm).  Please dont hesitate to call and ask to speak to one of our nurses for clinical concerns.   If you have a medical emergency, go to the nearest emergency room or call 911.  A surgeon from [facility] is always on call at the [facility]   [facility], [place] [address], [address], [place], [place]  [id number] ? MAIN: (336) [phone] ? TOLL FREE: [phone] ?  FAX [phone]) [phone] [email or web address]

## 2013-07-22 NOTE — Op Note (Signed)
Appendectomy, Lap, Procedure Note   Indications: The patient presented with a history of right-sided abdominal pain. A CT scan revealed findings consistent A right adnexal mass. However an ultrasound of the pelvis showed no abnormalities of the right ovary. The patient denied any genital symptoms. However her examination worsened overnight. She was having more pain and her white count increased with From 0.5-10.5.We recommended diagnostic laparoscopy and appendectomy.  Pre-operative Diagnosis: Abdominal pain, right lower quadrant  Post-operative Diagnosis: Same  Surgeon: Burle Kwan K.   Assistants: None  Anesthesia: General endotracheal anesthesia  ASA Class: 1  Procedure Details  The patient was seen again in the Holding Room. The risks, benefits, complications, treatment options, and expected outcomes were discussed with the patient and/or family. The possibilities of reaction to medication, perforation of viscus, bleeding, recurrent infection, finding a normal appendix, the need for additional procedures, failure to diagnose a condition, and creating a complication requiring transfusion or operation were discussed. There was concurrence with the proposed plan and informed consent was obtained. The site of surgery was properly noted. The patient was taken to Operating Room, identified as Nyelle Vlcek and the procedure verified as Appendectomy. A Time Out was held and the above information confirmed.  The patient was placed in the supine position and general anesthesia was induced.  The abdomen was prepped and draped in a sterile fashion. A one centimeter infraumbilical incision was made.  Dissection was carried down to the fascia bluntly.  The fascia was incised vertically.  We entered the peritoneal cavity bluntly.  A pursestring suture was passed around the incision with a 0 Vicryl.  The Hasson cannula was introduced into the abdomen and the tails of the suture were used to hold  the Hasson in place.   The pneumoperitoneum was then established maintaining a maximum pressure of 15 mmHg.  Additional 5 mm cannulas then placed in the left lower quadrant of the abdomen and the right upper quadrant under direct visualization. A careful evaluation of the entire abdomen was carried out. The patient was placed in Trendelenburg and left lateral decubitus position.  The scope was moved to the right upper quadrant port site. There was some cloudy-appearing fluid in the pelvis. Both ovaries were examined and showed no abnormalities. The right fallopian tube showed some inflammation at its distal end. The fimbria. However this area was soft and did not appear to contain an abscess.  The cecum was mobilized medially.  The appendix appeared small and normal in size.  The appendix was carefully dissected. The appendix was was skeletonized with the harmonic scalpel.   The appendix was divided at its base using an endo-GIA stapler. Minimal appendiceal stump was left in place. There was no evidence of bleeding, leakage, or complication after division of the appendix. Irrigation was also performed and irrigate suctioned from the abdomen as well.  The umbilical port site was closed with the purse string suture. There was no residual palpable fascial defect.  The trocar site skin wounds were closed with 4-0 Monocryl.  Instrument, sponge, and needle counts were correct at the conclusion of the case.   Findings: The appendix was found to be normal in appearance.  Estimated Blood Loss:  Minimal         Drains: None         Specimens: Appendix         Complications:  None; patient tolerated the procedure well.         Disposition: PACU - hemodynamically stable.  Condition: stable  Wilmon Arms. Corliss Skains, MD, [facility]  General/ Trauma Surgery  07/22/2013 11:31 AM

## 2013-07-22 NOTE — Transfer of Care (Signed)
Immediate Anesthesia Transfer of Care Note  Patient: Laurie [name]  Procedure(s) Performed: Procedure(s): APPENDECTOMY LAPAROSCOPIC (N/A) LAPAROSCOPY DIAGNOSTIC (N/A)  Patient Location: PACU  Anesthesia Type:General  Level of Consciousness: awake and alert   Airway & Oxygen Therapy: Patient Spontanous Breathing and Patient connected to nasal cannula oxygen  Post-op Assessment: Report given to PACU RN, Post -op Vital signs reviewed and stable and Patient moving all extremities X 4  Post vital signs: Reviewed and stable  Complications: No apparent anesthesia complications

## 2013-07-22 NOTE — Progress Notes (Signed)
Nutrition Brief Note  Patient identified on the Malnutrition Screening Tool (MST) Report  Wt Readings from Last 15 Encounters:  07/21/13 162 lb (73.483 kg)  07/21/13 162 lb (73.483 kg)  12/15/12 162 lb (73.483 kg)  11/27/12 158 lb (71.668 kg)  11/21/12 152 lb (68.947 kg)  09/30/12 138 lb (62.596 kg)  09/09/12 135 lb (61.236 kg)  09/05/12 134 lb 6.4 oz (60.963 kg)  07/26/12 123 lb (55.792 kg)  07/19/12 122 lb (55.339 kg)  06/07/12 117 lb 6 oz (53.241 kg)  05/25/12 115 lb 8 oz (52.39 kg)  Per H&P pt had SVD on 12/16/12  Body mass index is 26.96 kg/(m^2). Patient meets criteria for Overweight based on current BMI.  Pt states that she has had a poor appetite for the past few months and has been eating less, causing her to lose 10 lbs. No evidence of weight loss per chart; question accuracy of current weight.   Current diet order is Clear Liquids. Pt being discharged today. Labs and medications reviewed. No nutrition interventions warranted at this time.   Ian Malkineanne Barnett RD, LDN Inpatient Clinical Dietitian Pager: [phone] After Hours Pager: [phone]

## 2013-07-22 NOTE — Discharge Summary (Signed)
Physician Discharge Summary  Patient ID: Laurie [name] MRN: [id number] DOB/AGE: 04-Dec-1990 23 y.o.  Admit date: 07/21/2013 Discharge date: 07/22/2013  Admitting Diagnosis: RLQ abdominal pain, possible acute appendicitis   Discharge Diagnosis Patient Active Problem List   Diagnosis Date Noted  . Gonorrhea 07/22/2013  . RLQ abdominal pain 07/21/2013  . SVD (spontaneous vaginal delivery) 12/16/2012  . H/O fetal biophysical profile 4/8 12/15/2012  . Asthma 07/16/2012    Consultants none  Imaging: US Transvaginal Non-ob  07/21/2013   CLINICAL DATA:  Right lower quadrant pain. Last menstrual period 7 days ago.  EXAM: TRANSABDOMINAL AND TRANSVAGINAL ULTRASOUND OF PELVIS  TECHNIQUE: Both transabdominal and transvaginal ultrasound examinations of the pelvis were performed. Transabdominal technique was performed for global imaging of the pelvis including uterus, ovaries, adnexal regions, and pelvic cul-de-sac. It was necessary to proceed with endovaginal exam following the transabdominal exam to visualize the uterus, ovaries, and adnexa.  COMPARISON:  US FETAL BPP W/O NONSTRESS dated 12/15/2012; CT ABD/PELVIS W CM dated 07/21/2013  FINDINGS: Uterus:  9.1 x 4.4 x 5.5 cm. Normal in morphology.  Endometrium:  Normal, 9 mm.  Right Ovary:  3.1 x 1.9 x 2.0 cm. Normal in morphology.  Left Ovary:  3.1 x 1.7 x 1.7 cm. Normal in morphology.  Other Findings:  No significant free fluid.  IMPRESSION: Normal pelvic ultrasound. No explanation for right lower quadrant pain.   Electronically Signed   By: Jeronimo Greaves M.D.   On: 07/21/2013 13:32   US Pelvis Complete  07/21/2013   CLINICAL DATA:  Right lower quadrant pain. Last menstrual period 7 days ago.  EXAM: TRANSABDOMINAL AND TRANSVAGINAL ULTRASOUND OF PELVIS  TECHNIQUE: Both transabdominal and transvaginal ultrasound examinations of the pelvis were performed. Transabdominal technique was performed for global imaging of the pelvis including uterus, ovaries,  adnexal regions, and pelvic cul-de-sac. It was necessary to proceed with endovaginal exam following the transabdominal exam to visualize the uterus, ovaries, and adnexa.  COMPARISON:  US FETAL BPP W/O NONSTRESS dated 12/15/2012; CT ABD/PELVIS W CM dated 07/21/2013  FINDINGS: Uterus:  9.1 x 4.4 x 5.5 cm. Normal in morphology.  Endometrium:  Normal, 9 mm.  Right Ovary:  3.1 x 1.9 x 2.0 cm. Normal in morphology.  Left Ovary:  3.1 x 1.7 x 1.7 cm. Normal in morphology.  Other Findings:  No significant free fluid.  IMPRESSION: Normal pelvic ultrasound. No explanation for right lower quadrant pain.   Electronically Signed   By: Jeronimo Greaves M.D.   On: 07/21/2013 13:32   Ct Abdomen Pelvis W Contrast  07/21/2013   CLINICAL DATA:  Abdominal pain  EXAM: CT ABDOMEN AND PELVIS WITH CONTRAST  TECHNIQUE: Multidetector CT imaging of the abdomen and pelvis was performed using the standard protocol following bolus administration of intravenous contrast. The study is somewhat limited due to the incomplete opacification of the bowel and the relative paucity of intra-abdominal fat.  CONTRAST:  80mL OMNIPAQUE IOHEXOL 300 MG/ML  SOLN  COMPARISON:  None.  FINDINGS: The liver exhibits a mildly heterogeneous enhancement pattern. There is no focal mass nor ductal dilation. The gallbladder is contracted. I cannot exclude tiny layering gallstones demonstrated on images 25 and 26. The pancreas, spleen, adrenal glands, and kidneys are normal in appearance. The caliber of the abdominal aorta is normal.  The stomach is moderately distended with food and oral contrast. Orally administered contrast has traversed much of the normal calibered small bowel but has not yet reached the colon. The stool and gas pattern  within the colon is within the limits of normal. The appendix is not discretely identified. No inflammatory changes in the right lower quadrant of the abdomen are demonstrated.  Within the pelvis the uterus demonstrates normal density and  contour. There is a complex appearing cystic right adnexal process demonstrated on images 66-75. The left adnexal region demonstrates no suspicious findings. There is no significant free fluid in the cul de sac. The rectum contains stool and gas. There is no inguinal nor significant umbilical hernia. There is an umbilical ring present.  The lumbar vertebral bodies are preserved in height. The bony pelvis exhibits no acute abnormality. The lung bases are clear.  IMPRESSION: 1. There is no evidence of bowel obstruction or acute inflammation. The study is limited in that oral contrast has not reached the colon. 2. I cannot exclude punctate gallstones but there is no evidence of gallbladder distention or wall thickening. The remainder of the hepatobiliary tree appears normal. 3. There is no acute abnormality of the kidneys or the urinary bladder. 4. There is poor definition of the right adnexal structures were complex appearing hypodensity is present. This is structure may measure up to 3 cm in diameter. Further evaluation with pelvic ultrasound would be of value 5. There is no intra abdominal nor pelvic lymphadenopathy nor evidence of an inflammatory mass. The lung bases are clear.   Electronically Signed   By: David  [place]   On: 07/21/2013 13:23    Procedures Laparoscopic appendectomy Dr. Corliss Skainssuei 07/22/13  Hospital Course:  Laurie [name] is a healthy [facility] abdominal pain for 3 days. CT scan of abdomen could not visualize the appendix, there was evidence hypodensity to right adnexal region.   She has a normal white count, negative UA, UPT.  She was monitored overnight, the following morning, WBC increased to 10.3K.  The decision was made to proceed with a diagnostic laparoscopy as we were concerned for acute appendicitis.  The surgery revealed fluid in the pelvis which was cloudy appearing, this was irrigated, the right fallopian tube showed inflammation, the appendix was  normal in size.  The appendix was removed. Tolerated procedure well and was transferred to the floor. Her STD screen then came back positive for gonorrhea.  She was treated with IM rocephin and 1 dose of Azithromycin to cover for chlamydia.  We discussed mode of transmission, safe sex practices.  Nursing instructed to report to health department.  Diet was advanced as tolerated.  On POD #0, the patient was voiding well, tolerating diet, ambulating well, pain well controlled, vital signs stable, incisions c/d/i and felt stable for discharge home.  Patient will follow up in our office in 3 weeks and knows to call with questions or concerns.  Physical Exam: General:  Alert, NAD, pleasant, comfortable Abd:  Soft, ND, mild tenderness, incisions C/D/I    Medication List         acetaminophen 325 MG tablet  Commonly known as:  TYLENOL  Take 2 tablets (650 mg total) by mouth every 4 (four) hours as needed for mild pain.     albuterol 108 (90 BASE) MCG/ACT inhaler  Commonly known as:  PROVENTIL HFA;VENTOLIN HFA  Inhale 2 puffs into the lungs every 6 (six) hours as needed for wheezing.     oxyCODONE-acetaminophen 5-325 MG per tablet  Commonly known as:  PERCOCET/ROXICET  Take 1 tablet by mouth every 6 (six) hours as needed for moderate pain.  Follow-up Information   Follow up with [facility] On 08/16/2013. (arrive by 1:30PM for a 2PM appt)    Contact information:   [address]   [place] [address] [phone]       Signed: Ashok Norris, [facility] [phone]  07/22/2013, 2:33 PM

## 2013-07-22 NOTE — ED Provider Notes (Signed)
Medical screening examination/treatment/procedure(s) were conducted as a shared visit with resident-physician practitioner(s) and myself.  I personally evaluated the patient during the encounter.  Pt is a 23 y.o. female with pmhx as above presenting with RLQ pain.  Pt found to have localized RLQ ttp on PE w/ +rovsing's sign.  She is HDS, in NAD.  CT A/P showed R adnexal process, no visualized appendix.  TVUS showed no abnormalities.  Gen surg cx'd for continued concern for appendicitis.  Pt admitted for observation.     Shanna CiscoMegan E Jermany Rimel, MD 07/22/13 1044

## 2013-07-22 NOTE — Discharge Summary (Signed)
Wilmon ArmsMatthew K. Corliss Skainssuei, MD, [facility]  General/ Trauma Surgery  07/22/2013 4:45 PM

## 2013-07-22 NOTE — Anesthesia Postprocedure Evaluation (Signed)
Anesthesia Post Note  Patient: Laurie [name]  Procedure(s) Performed: Procedure(s) (LRB): APPENDECTOMY LAPAROSCOPIC (N/A) LAPAROSCOPY DIAGNOSTIC (N/A)  Anesthesia type: General  Patient location: PACU  Post pain: Pain level controlled  Post assessment: Patient's Cardiovascular Status Stable  Last Vitals:  Filed Vitals:   07/22/13 1200  BP: 109/63  Pulse:   Temp:   Resp:     Post vital signs: Reviewed and stable  Level of consciousness: alert  Complications: No apparent anesthesia complications

## 2013-07-22 NOTE — Anesthesia Procedure Notes (Signed)
**Note De-[name] via Obfuscation** Procedure Name: Intubation Date/Time: 07/22/2013 10:29 AM [name] by: Sarita HaverFLOWERS, Laurie [name], Laurie [name], Laurie [name], Laurie [name] Patient Re-evaluated:Patient Re-evaluated prior to inductionOxygen Delivery Method: Circle system utilized and Simple face mask Preoxygenation: Pre-oxygenation with 100% oxygen Intubation Type: IV induction Ventilation: Mask ventilation without difficulty Laryngoscope Size: Mac and 3 Grade View: Grade I Tube type: Oral Tube size: 7.0 mm Number of attempts: 1 Airway Equipment and Method: Patient positioned with wedge pillow and Stylet Placement Confirmation: ETT inserted through vocal cords under direct vision,  positive ETCO2 and breath sounds checked- equal and bilateral Secured at: 21 cm Tube secured with: Tape Dental Injury: Teeth and Oropharynx as per pre-operative assessment

## 2013-07-22 NOTE — Progress Notes (Signed)
Subjective: Patient had some nausea and vomiting last night Pain seems worse than yesterday  Objective: Vital signs in last 24 hours: Temp:  [97.3 F (36.3 C)-98.4 F (36.9 C)] 98.3 F (36.8 C) (01/09 16100614) Pulse Rate:  [68-90] 90 (01/09 0614) Resp:  [17-18] 18 (01/09 0614) BP: (99-120)/(54-96) 106/57 mmHg (01/09 0614) SpO2:  [96 %-100 %] 100 % (01/09 0614) Weight:  [162 lb (73.483 kg)] 162 lb (73.483 kg) (01/08 1922)    Intake/Output from previous day: 01/08 0701 - 01/09 0700 In: 48.3 [I.V.:48.3] Out: -  Intake/Output this shift:    General appearance: alert, cooperative and no distress GI: soft; bilateral lower quadrant tenderness R>L  Lab Results:   Recent Labs  07/21/13 1114 07/22/13 0639  WBC 7.3 10.3  HGB 13.4 11.2*  HCT 41.2 34.4*  PLT 317 286   BMET  Recent Labs  07/21/13 1114 07/22/13 0639  NA 142 137  K 3.9 PENDING  CL 104 103  CO2 21 21  GLUCOSE 59* 84  BUN 10 6  CREATININE 0.56 0.65  CALCIUM 9.7 8.9   PT/INR No results found for this basename: LABPROT, INR,  in the last 72 hours ABG No results found for this basename: PHART, PCO2, PO2, HCO3,  in the last 72 hours  Studies/Results: [place] Transvaginal Non-ob  07/21/2013   CLINICAL DATA:  Right lower quadrant pain. Last menstrual period 7 days ago.  EXAM: TRANSABDOMINAL AND TRANSVAGINAL ULTRASOUND OF PELVIS  TECHNIQUE: Both transabdominal and transvaginal ultrasound examinations of the pelvis were performed. Transabdominal technique was performed for global imaging of the pelvis including uterus, ovaries, adnexal regions, and pelvic cul-de-sac. It was necessary to proceed with endovaginal exam following the transabdominal exam to visualize the uterus, ovaries, and adnexa.  COMPARISON:  US FETAL BPP W/O NONSTRESS dated 12/15/2012; CT ABD/PELVIS W CM dated 07/21/2013  FINDINGS: Uterus:  9.1 x 4.4 x 5.5 cm. Normal in morphology.  Endometrium:  Normal, 9 mm.  Right Ovary:  3.1 x 1.9 x 2.0 cm. Normal in  morphology.  Left Ovary:  3.1 x 1.7 x 1.7 cm. Normal in morphology.  Other Findings:  No significant free fluid.  IMPRESSION: Normal pelvic ultrasound. No explanation for right lower quadrant pain.   Electronically Signed   By: Jeronimo GreavesKyle  Talbot M.D.   On: 07/21/2013 13:32   [place] Pelvis Complete  07/21/2013   CLINICAL DATA:  Right lower quadrant pain. Last menstrual period 7 days ago.  EXAM: TRANSABDOMINAL AND TRANSVAGINAL ULTRASOUND OF PELVIS  TECHNIQUE: Both transabdominal and transvaginal ultrasound examinations of the pelvis were performed. Transabdominal technique was performed for global imaging of the pelvis including uterus, ovaries, adnexal regions, and pelvic cul-de-sac. It was necessary to proceed with endovaginal exam following the transabdominal exam to visualize the uterus, ovaries, and adnexa.  COMPARISON:  US FETAL BPP W/O NONSTRESS dated 12/15/2012; CT ABD/PELVIS W CM dated 07/21/2013  FINDINGS: Uterus:  9.1 x 4.4 x 5.5 cm. Normal in morphology.  Endometrium:  Normal, 9 mm.  Right Ovary:  3.1 x 1.9 x 2.0 cm. Normal in morphology.  Left Ovary:  3.1 x 1.7 x 1.7 cm. Normal in morphology.  Other Findings:  No significant free fluid.  IMPRESSION: Normal pelvic ultrasound. No explanation for right lower quadrant pain.   Electronically Signed   By: Jeronimo GreavesKyle  Talbot M.D.   On: 07/21/2013 13:32   Ct Abdomen Pelvis W Contrast  07/21/2013   CLINICAL DATA:  Abdominal pain  EXAM: CT ABDOMEN AND PELVIS WITH CONTRAST  TECHNIQUE: Multidetector CT imaging of the abdomen and pelvis was performed using the standard protocol following bolus administration of intravenous contrast. The study is somewhat limited due to the incomplete opacification of the bowel and the relative paucity of intra-abdominal fat.  CONTRAST:  80mL OMNIPAQUE IOHEXOL 300 MG/ML  SOLN  COMPARISON:  None.  FINDINGS: The liver exhibits a mildly heterogeneous enhancement pattern. There is no focal mass nor ductal dilation. The gallbladder is contracted. I cannot  exclude tiny layering gallstones demonstrated on images 25 and 26. The pancreas, spleen, adrenal glands, and kidneys are normal in appearance. The caliber of the abdominal aorta is normal.  The stomach is moderately distended with food and oral contrast. Orally administered contrast has traversed much of the normal calibered small bowel but has not yet reached the colon. The stool and gas pattern within the colon is within the limits of normal. The appendix is not discretely identified. No inflammatory changes in the right lower quadrant of the abdomen are demonstrated.  Within the pelvis the uterus demonstrates normal density and contour. There is a complex appearing cystic right adnexal process demonstrated on images 66-75. The left adnexal region demonstrates no suspicious findings. There is no significant free fluid in the cul de sac. The rectum contains stool and gas. There is no inguinal nor significant umbilical hernia. There is an umbilical ring present.  The lumbar vertebral bodies are preserved in height. The bony pelvis exhibits no acute abnormality. The lung bases are clear.  IMPRESSION: 1. There is no evidence of bowel obstruction or acute inflammation. The study is limited in that oral contrast has not reached the colon. 2. I cannot exclude punctate gallstones but there is no evidence of gallbladder distention or wall thickening. The remainder of the hepatobiliary tree appears normal. 3. There is no acute abnormality of the kidneys or the urinary bladder. 4. There is poor definition of the right adnexal structures were complex appearing hypodensity is present. This is structure may measure up to 3 cm in diameter. Further evaluation with pelvic ultrasound would be of value 5. There is no intra abdominal nor pelvic lymphadenopathy nor evidence of an inflammatory mass. The lung bases are clear.   Electronically Signed   By: David  [place]   On: 07/21/2013 13:23    Anti-infectives: Anti-infectives    Start     Dose/Rate Route Frequency Ordered Stop   07/21/13 2200  piperacillin-tazobactam (ZOSYN) IVPB 3.375 g     3.375 g 12.5 mL/hr over 240 Minutes Intravenous 3 times per day 07/21/13 1859        Assessment/Plan: s/p * No surgery found * Recommend laparoscopic appendectomy today. Her appendix was not visualized on CT scan and this "right adnexal mass" was not visualized on ultrasound.  She had normal right ovary on ultrasound.   The surgical procedure has been discussed with the patient.  Potential risks, benefits, alternative treatments, and expected outcomes have been explained.  All of the patient's questions at this time have been answered.  The likelihood of reaching the patient's treatment goal is good.  The patient understand the proposed surgical procedure and wishes to proceed.   LOS: 1 day    Kourtnei Rauber K. 07/22/2013

## 2013-07-25 ENCOUNTER — Encounter (HOSPITAL_COMMUNITY): Payer: Self-pay | Admitting: Surgery

## 2013-07-27 ENCOUNTER — Encounter (INDEPENDENT_AMBULATORY_CARE_PROVIDER_SITE_OTHER): Payer: Self-pay | Admitting: *Deleted

## 2013-07-27 ENCOUNTER — Ambulatory Visit: Payer: Self-pay | Admitting: Medical

## 2013-08-16 ENCOUNTER — Encounter: Payer: Self-pay | Admitting: Medical

## 2013-08-16 ENCOUNTER — Ambulatory Visit (INDEPENDENT_AMBULATORY_CARE_PROVIDER_SITE_OTHER): Payer: No Typology Code available for payment source | Admitting: Medical

## 2013-08-16 ENCOUNTER — Encounter (INDEPENDENT_AMBULATORY_CARE_PROVIDER_SITE_OTHER): Payer: PRIVATE HEALTH INSURANCE | Admitting: General Surgery

## 2013-08-16 VITALS — BP 98/60 | HR 68 | Temp 98.4°F | Resp 16 | Ht 65.0 in | Wt 118.0 lb

## 2013-08-16 DIAGNOSIS — R300 Dysuria: Secondary | ICD-10-CM

## 2013-08-16 DIAGNOSIS — R5381 Other malaise: Secondary | ICD-10-CM

## 2013-08-16 DIAGNOSIS — R202 Paresthesia of skin: Secondary | ICD-10-CM

## 2013-08-16 DIAGNOSIS — R531 Weakness: Secondary | ICD-10-CM

## 2013-08-16 DIAGNOSIS — R209 Unspecified disturbances of skin sensation: Secondary | ICD-10-CM

## 2013-08-16 DIAGNOSIS — R5383 Other fatigue: Secondary | ICD-10-CM

## 2013-08-16 DIAGNOSIS — IMO0001 Reserved for inherently not codable concepts without codable children: Secondary | ICD-10-CM

## 2013-08-16 DIAGNOSIS — M791 Myalgia, unspecified site: Secondary | ICD-10-CM

## 2013-08-16 LAB — POCT URINALYSIS DIPSTICK
Bilirubin, UA: NEGATIVE
Blood, UA: NEGATIVE
GLUCOSE UA: NEGATIVE
KETONES UA: NEGATIVE
Nitrite, UA: NEGATIVE
Spec Grav, UA: 1.015
Urobilinogen, UA: NEGATIVE
pH, UA: 5

## 2013-08-16 LAB — IRON AND TIBC
%SAT: 8 % — ABNORMAL LOW (ref 20–55)
Iron: 31 ug/dL — ABNORMAL LOW (ref 42–145)
TIBC: 369 ug/dL (ref 250–470)
UIBC: 338 ug/dL (ref 125–400)

## 2013-08-16 LAB — CK: CK TOTAL: 93 U/L (ref 7–177)

## 2013-08-16 LAB — SEDIMENTATION RATE: Sed Rate: 4 mm/hr (ref 0–22)

## 2013-08-16 NOTE — Progress Notes (Signed)
Subjective:  Laurie [name] is a 23 y.o. female who presents today as a new patient.  Originally from [place].    Of note, she was recently seen in the emergency department in January for appendicitis, had an appendectomy , also found to be positive for gonorrhea and chlamydia. She says that she was treated with a shot of antibiotics for the STD.  She is here mainly today for chronic history of pain and paresthesias.  She notes that ever since age 60 years old, she has had intermittent pains down both legs, can be sometimes just a left lower leg, sometimes can be both legs from the low back to the toes.  Although she denies frank numbness, she feels like the sensation in her left arm and leg is always dulled.  Her left leg has the worse symptoms in general.  She gets pins and needles feelings in the left leg. She feels like her left arm and left leg are weaker than the right side of her body. Her eye doctor says her left eye is lazy.  She does report chronic back pain for the last 4-5 months. She denies any prior injury, head injury, fall, trauma.  She does have history of chronic headaches, migraines. She reportedly had a seizure back in 2009 but none since.  Using nothing for the symptoms. No other aggravating or relieving factors.    No other c/o.  Review of Systems Constitutional: -fever, -chills, -sweats, -unexpected weight change,-fatigue ENT: -runny nose, -ear pain, -sore throat Cardiology:  -chest pain, -palpitations, -edema Respiratory: -cough, -shortness of breath, -wheezing Gastroenterology: -abdominal pain, -nausea, -vomiting, -diarrhea, -constipation  Hematology: -bleeding or bruising problems Musculoskeletal: -arthralgias,-joint swelling Urology: -dysuria, -difficulty urinating, -hematuria, -urinary frequency, -urgency   The following portions of the patient's history were reviewed and updated as appropriate:  allergies, current medications, past family history, past medical history, past social history, past surgical history and problem list.  ROS Otherwise as in subjective above  Objective: Physical Exam BP 98/60  Pulse 68  Temp(Src) 98.4 F (36.9 C) (Oral)  Resp 16  Ht 5\' 5"  (1.651 m)  Wt 118 lb (53.524 kg)  BMI 19.64 kg/m2   General appearance: alert, no distress, WD/WN, lean black female HEENT: normocephalic, sclerae anicteric, conjunctiva pink and moist, TMs pearly, nares patent, no discharge or erythema, pharynx normal Oral cavity: MMM, no lesions Neck: supple, no lymphadenopathy, slight increased size of thyroid, no distinct nodules, possible small goiter, no , no bruits Heart: RRR, normal S1, S2, no murmurs Lungs: CTA bilaterally, no wheezes, rhonchi, or rales Abdomen: +bs, surgical scars, mild lower abdomen tenderness in general, otherwise soft, non tender, non distended, no masses, no hepatomegaly, no splenomegaly Pulses: 2+ radial pulses, 2+ pedal pulses, normal cap refill Ext: no edema, cyanosis, clubbing Neuro: CN II through XII intact, there does seem to be 4/5 strength of left arm and left leg throughout, left lower leg from knee down to the foot with decreased sensation to sharp and light touch throughout, otherwise the strength is normal of right side, DTRs normal, sensation otherwise normal throughout UE and LE Back: Generalized paraspinal muscle tenderness throughout the back, no midline tenderness    Assessment: Encounter Diagnoses  Name Primary?  . Paresthesia Yes  . Unilateral weakness   . Myalgia   . Dysuria      Plan: There are some obvious neurological findings of decreased left leg sensation and left-sided weakness upper and lower extremities.  etiology unclear  We discussed possible  causes, labs today.  She declines HIV test today.  Follow up: pending labs, possible referral

## 2013-08-17 ENCOUNTER — Other Ambulatory Visit: Payer: Self-pay | Admitting: Medical

## 2013-08-17 ENCOUNTER — Encounter (INDEPENDENT_AMBULATORY_CARE_PROVIDER_SITE_OTHER): Payer: Self-pay | Admitting: General Surgery

## 2013-08-17 DIAGNOSIS — R531 Weakness: Secondary | ICD-10-CM

## 2013-08-17 DIAGNOSIS — R202 Paresthesia of skin: Secondary | ICD-10-CM

## 2013-08-17 LAB — T4, FREE: Free T4: 1.14 ng/dL (ref 0.80–1.80)

## 2013-08-17 LAB — TSH: TSH: 1.732 u[IU]/mL (ref 0.350–4.500)

## 2013-08-17 LAB — VITAMIN B12: Vitamin B-12: 473 pg/mL (ref 211–911)

## 2013-08-17 LAB — VITAMIN D 25 HYDROXY (VIT D DEFICIENCY, FRACTURES): Vit D, 25-Hydroxy: 12 ng/mL — ABNORMAL LOW (ref 30–89)

## 2013-08-17 MED ORDER — FERROUS GLUCONATE 325 (36 FE) MG PO TABS: 1.0000 | ORAL_TABLET | Freq: Two times a day (BID) | ORAL | Status: DC

## 2013-08-17 MED ORDER — VITAMIN D (ERGOCALCIFEROL) 1.25 MG (50000 UNIT) PO CAPS: 50000.0000 [IU] | ORAL_CAPSULE | ORAL | Status: DC

## 2013-08-19 ENCOUNTER — Other Ambulatory Visit: Payer: Self-pay | Admitting: Family Medicine

## 2013-08-19 DIAGNOSIS — R202 Paresthesia of skin: Secondary | ICD-10-CM

## 2013-08-19 DIAGNOSIS — R531 Weakness: Secondary | ICD-10-CM

## 2013-08-19 LAB — VITAMIN E
Gamma-Tocopherol (Vit E): 0.8 mg/L (ref <=4.3)
Vitamin E (Alpha Tocopherol): 9.2 mg/L (ref 5.7–19.9)

## 2013-09-08 ENCOUNTER — Encounter: Payer: Self-pay | Admitting: Certified Nurse Midwife

## 2013-10-03 ENCOUNTER — Ambulatory Visit: Payer: PRIVATE HEALTH INSURANCE | Admitting: Neurology

## 2013-10-05 ENCOUNTER — Encounter: Payer: Self-pay | Admitting: Certified Nurse Midwife

## 2013-10-15 ENCOUNTER — Other Ambulatory Visit: Payer: Self-pay | Admitting: Family Medicine

## 2013-10-15 MED ORDER — ALBUTEROL SULFATE HFA 108 (90 BASE) MCG/ACT IN AERS: 2.0000 | INHALATION_SPRAY | Freq: Four times a day (QID) | RESPIRATORY_TRACT | Status: DC | PRN

## 2013-10-15 NOTE — Progress Notes (Signed)
Her inhaler lasts two months. I have asked her to set up an appt for next week to discuss  Her asthma.

## 2013-10-24 ENCOUNTER — Emergency Department (HOSPITAL_COMMUNITY): Payer: PRIVATE HEALTH INSURANCE

## 2013-10-24 ENCOUNTER — Encounter (HOSPITAL_COMMUNITY): Payer: Self-pay | Admitting: Emergency Medicine

## 2013-10-24 ENCOUNTER — Emergency Department (HOSPITAL_COMMUNITY)
Admission: EM | Admit: 2013-10-24 | Discharge: 2013-10-24 | Disposition: A | Discharge status: Home / Self Care | Payer: PRIVATE HEALTH INSURANCE | Attending: Emergency Medicine | Admitting: Emergency Medicine

## 2013-10-24 DIAGNOSIS — Z79899 Other long term (current) drug therapy: Secondary | ICD-10-CM | POA: Insufficient documentation

## 2013-10-24 DIAGNOSIS — Z8679 Personal history of other diseases of the circulatory system: Secondary | ICD-10-CM | POA: Insufficient documentation

## 2013-10-24 DIAGNOSIS — Z8619 Personal history of other infectious and parasitic diseases: Secondary | ICD-10-CM | POA: Insufficient documentation

## 2013-10-24 DIAGNOSIS — Z8669 Personal history of other diseases of the nervous system and sense organs: Secondary | ICD-10-CM | POA: Insufficient documentation

## 2013-10-24 DIAGNOSIS — D649 Anemia, unspecified: Secondary | ICD-10-CM | POA: Insufficient documentation

## 2013-10-24 DIAGNOSIS — J45909 Unspecified asthma, uncomplicated: Secondary | ICD-10-CM | POA: Insufficient documentation

## 2013-10-24 DIAGNOSIS — R110 Nausea: Secondary | ICD-10-CM | POA: Insufficient documentation

## 2013-10-24 DIAGNOSIS — J020 Streptococcal pharyngitis: Secondary | ICD-10-CM | POA: Insufficient documentation

## 2013-10-24 LAB — CBC WITH DIFFERENTIAL/PLATELET
BASOS ABS: 0 10*3/uL (ref 0.0–0.1)
Basophils Relative: 0 % (ref 0–1)
EOS PCT: 1 % (ref 0–5)
Eosinophils Absolute: 0.1 10*3/uL (ref 0.0–0.7)
HEMATOCRIT: 37.6 % (ref 36.0–46.0)
Hemoglobin: 12.7 g/dL (ref 12.0–15.0)
LYMPHS ABS: 1.7 10*3/uL (ref 0.7–4.0)
LYMPHS PCT: 14 % (ref 12–46)
MCH: 31.1 pg (ref 26.0–34.0)
MCHC: 33.8 g/dL (ref 30.0–36.0)
MCV: 91.9 fL (ref 78.0–100.0)
MONO ABS: 1.2 10*3/uL — AB (ref 0.1–1.0)
Monocytes Relative: 10 % (ref 3–12)
Neutro Abs: 9 10*3/uL — ABNORMAL HIGH (ref 1.7–7.7)
Neutrophils Relative %: 75 % (ref 43–77)
PLATELETS: 222 10*3/uL (ref 150–400)
RBC: 4.09 MIL/uL (ref 3.87–5.11)
RDW: 13.8 % (ref 11.5–15.5)
WBC: 12 10*3/uL — ABNORMAL HIGH (ref 4.0–10.5)

## 2013-10-24 LAB — BASIC METABOLIC PANEL
BUN: 4 mg/dL — ABNORMAL LOW (ref 6–23)
CO2: 20 meq/L (ref 19–32)
CREATININE: 0.64 mg/dL (ref 0.50–1.10)
Calcium: 9.4 mg/dL (ref 8.4–10.5)
Chloride: 100 mEq/L (ref 96–112)
GFR calc Af Amer: >90 mL/min (ref >90)
GLUCOSE: 97 mg/dL (ref 70–99)
Potassium: 3.9 mEq/L (ref 3.7–5.3)
SODIUM: 136 meq/L — AB (ref 137–147)

## 2013-10-24 LAB — RAPID STREP SCREEN (MED CTR MEBANE ONLY): Streptococcus, Group A Screen (Direct): POSITIVE — AB

## 2013-10-24 MED ORDER — HYDROCODONE-ACETAMINOPHEN 7.5-325 MG/15ML PO SOLN: 15.0000 mL | Freq: Three times a day (TID) | ORAL | Status: DC | PRN

## 2013-10-24 MED ORDER — IOHEXOL 300 MG/ML  SOLN
75.0000 mL | Freq: Once | INTRAMUSCULAR | Status: AC | PRN
  Administered 2013-10-24: 75 mL via INTRAVENOUS

## 2013-10-24 MED ORDER — DEXAMETHASONE SODIUM PHOSPHATE 10 MG/ML IJ SOLN
10.0000 mg | Freq: Once | INTRAMUSCULAR | Status: AC
  Administered 2013-10-24: 10 mg via INTRAVENOUS
  Filled 2013-10-24: qty 1

## 2013-10-24 MED ORDER — KETOROLAC TROMETHAMINE 30 MG/ML IJ SOLN
30.0000 mg | Freq: Once | INTRAMUSCULAR | Status: AC
  Administered 2013-10-24: 30 mg via INTRAVENOUS
  Filled 2013-10-24: qty 1

## 2013-10-24 MED ORDER — SODIUM CHLORIDE 0.9 % IV BOLUS (SEPSIS)
1000.0000 mL | Freq: Once | INTRAVENOUS | Status: AC
  Administered 2013-10-24: 1000 mL via INTRAVENOUS

## 2013-10-24 MED ORDER — ONDANSETRON 4 MG PO TBDP: 4.0000 mg | ORAL_TABLET | Freq: Three times a day (TID) | ORAL | Status: DC | PRN

## 2013-10-24 MED ORDER — PENICILLIN G BENZATHINE 1200000 UNIT/2ML IM SUSP
1.2000 10*6.[IU] | Freq: Once | INTRAMUSCULAR | Status: AC
  Administered 2013-10-24: 1.2 10*6.[IU] via INTRAMUSCULAR
  Filled 2013-10-24: qty 2

## 2013-10-24 NOTE — ED Notes (Signed)
Returned from ct scan 

## 2013-10-24 NOTE — Discharge Instructions (Signed)
Take Vicodin as needed for pain. Take Zofran as needed for nausea. Refer to attached documents for more information. Return to the ED with worsening or concerning symptoms.

## 2013-10-24 NOTE — ED Notes (Signed)
The pt is c/o fever chills headache nausea  Vomiting for 2-3 days.  Hurting all over.  lmp 2 weeks ago

## 2013-10-24 NOTE — ED Provider Notes (Signed)
CSN: [id number]     Arrival date & time 10/24/13  4098 History   First MD Initiated Contact with Patient 10/24/13 [phone]     Chief Complaint  Patient presents with  . multiple complaints      (Consider location/radiation/quality/duration/timing/severity/associated sxs/prior Treatment) HPI Comments: Patient is a 23 year old female who presents with a 3 day history of sore throat. Patient reports gradual onset and progressively worsening sharp, severe throat pain. The pain is constant and made worse with swallowing. The pain is localized to the patient's throat and more notable on the right side than the left. Nothing alleviates the pain. The patient has tried Nyquil for symptoms without relief. Patient reports associated subjective fever, chills, cervical adenopathy, difficulty swallowing, nausea and headache. Patient denies visual changes, sinus congestion, difficulty breathing, chest pain, SOB, abdominal pain, vomiting, diarrhea.      Past Medical History  Diagnosis Date  . Abnormal Pap smear     LAST PAP 09/2010  . Preterm labor 2011  . Anemia     CHILDHOOD  . Headache(784.0)     FREQUENT  . Asthma 2010  . Seizures 2009    WITH MIGRAINE  . SVD (spontaneous vaginal delivery) 12/16/2012  . Wears glasses   . Gonorrhea 07/2013   Past Surgical History  Procedure Laterality Date  . Appendectomy  07/22/2013  . Laparoscopic appendectomy N/A 07/22/2013    Procedure: APPENDECTOMY LAPAROSCOPIC;  Surgeon: Wilmon Arms. Corliss Skains, MD;  Location: [facility] OR;  Service: General;  Laterality: N/A;  . Laparoscopy N/A 07/22/2013    Procedure: LAPAROSCOPY DIAGNOSTIC;  Surgeon: Wilmon Arms. Corliss Skains, MD;  Location: [facility] OR;  Service: General;  Laterality: N/A;   Family History  Problem Relation Age of Onset  . Hypertension Father   . Asthma Father   . Asthma Brother   . Arthritis Paternal Aunt    History  Substance Use Topics  . Smoking status: Never Smoker   . Smokeless tobacco: Never Used  . Alcohol Use: Yes   Comment: 07/22/2013 "birthday parties, etc; a few times/yr"   OB History   Grav Para Term Preterm Abortions TAB SAB Ect Mult Living   3 3 2 1  0 0 0 0 0 3     Review of Systems  Constitutional: Positive for chills and fatigue. Negative for fever.  HENT: Positive for sore throat and trouble swallowing.   Eyes: Negative for visual disturbance.  Respiratory: Negative for shortness of breath.   Cardiovascular: Negative for chest pain and palpitations.  Gastrointestinal: Positive for nausea. Negative for vomiting, abdominal pain and diarrhea.  Genitourinary: Negative for dysuria and difficulty urinating.  Musculoskeletal: Negative for arthralgias and neck pain.  Skin: Negative for color change.  Neurological: Positive for headaches. Negative for dizziness and weakness.  Psychiatric/Behavioral: Negative for dysphoric mood.      Allergies  Review of patient's allergies indicates no known allergies.  Home Medications   Current Outpatient Rx  Name  Route  Sig  Dispense  Refill  . acetaminophen (TYLENOL) 325 MG tablet   Oral   Take 2 tablets (650 mg total) by mouth every 4 (four) hours as needed for mild pain.         Marland Kitchen albuterol (PROVENTIL HFA;VENTOLIN HFA) 108 (90 BASE) MCG/ACT inhaler   Inhalation   Inhale 2 puffs into the lungs every 6 (six) hours as needed for wheezing.   1 Inhaler   0   . Ferrous Gluconate 325 (36 FE) MG TABS   Oral  Take 1 tablet by mouth 2 (two) times daily.   60 tablet   2   . Vitamin D, Ergocalciferol, (DRISDOL) 50000 UNITS CAPS capsule   Oral   Take 1 capsule (50,000 Units total) by mouth every 7 (seven) days.   30 capsule   0    BP 107/73  Pulse 105  Temp(Src) 100.3 F (37.9 C) (Oral)  Resp 23  Ht 5\' 5"  (1.651 m)  Wt 125 lb (56.7 kg)  BMI 20.80 kg/m2  SpO2 97%  LMP 10/10/2013 Physical Exam  Nursing note and vitals reviewed. Constitutional: She is oriented to person, place, and time. She appears well-developed and well-nourished. No  distress.  Patient speaking in "hot potato" voice with muffled speech.   HENT:  Head: Normocephalic and atraumatic.  Mouth/Throat: Oropharyngeal exudate present.  Bilateral tonsillar edema and erythema with exudate noted bilaterally.   Eyes: Conjunctivae and EOM are normal.  Neck: Normal range of motion.  Right anterior cervical tenderness to palpation.   Cardiovascular: Normal rate and regular rhythm.  Exam reveals no gallop and no friction rub.   No murmur heard. Pulmonary/Chest: Effort normal and breath sounds normal. She has no wheezes. She has no rales. She exhibits no tenderness.  Abdominal: Soft. She exhibits no distension. There is no tenderness. There is no rebound.  Musculoskeletal: Normal range of motion.  Lymphadenopathy:    She has cervical adenopathy.  Neurological: She is alert and oriented to person, place, and time. Coordination normal.  Speech is goal-oriented. Moves limbs without ataxia.   Skin: Skin is warm and dry.  Psychiatric: She has a normal mood and affect. Her behavior is normal.    ED Course  Procedures (including critical care time) Labs Review Labs Reviewed  RAPID STREP SCREEN - Abnormal; Notable for the following:    Streptococcus, Group A Screen (Direct) POSITIVE (*)    All other components within normal limits  CBC WITH DIFFERENTIAL - Abnormal; Notable for the following:    WBC 12.0 (*)    Neutro Abs 9.0 (*)    Monocytes Absolute 1.2 (*)    All other components within normal limits  BASIC METABOLIC PANEL - Abnormal; Notable for the following:    Sodium 136 (*)    BUN 4 (*)    All other components within normal limits   Imaging Review Ct Soft Tissue Neck W Contrast  10/24/2013   CLINICAL DATA:  23 year old female with sore throat. Fever, chills, nausea, vomiting, pain. Initial encounter.  EXAM: CT NECK WITH CONTRAST  TECHNIQUE: Multidetector CT imaging of the neck was performed using the standard protocol following the bolus administration of  intravenous contrast.  CONTRAST:  75mL OMNIPAQUE IOHEXOL 300 MG/ML  SOLN  COMPARISON:  Head CT without contrast 07/18/2008.  FINDINGS: Negative lung apices. Negative visualized superior mediastinum. Negative thyroid, larynx, parapharyngeal spaces, sublingual space, submandibular glands, parotid glands and visualized orbits soft tissues. Grossly negative visualized brain parenchyma. Major vascular structures in the neck and at the skullbase are patent. The right vertebral artery is dominant.  There is bilateral fairly symmetric tonsillar pillar enlargement and hyper enhancement. There is adenoid hyper enhancement with a variegated appearance of low density within the adenoids (series 2, image 17). There is a less pronounced variegated enhancement pattern of the tonsillar pillars. There is a small retropharyngeal effusion (series 2, image 33). No organized or drainable fluid collection is identified. There are hyperenhancing level 2 lymph nodes, mildly larger on the right and measuring up to 9 mm  short axis. No cystic or necrotic nodes are identified. Other lymph nodal stations are within normal limits.  Minimal paranasal sinus mucosal thickening. No paranasal sinus fluid levels in the visible sinuses. Visible mastoids and tympanic cavities are clear. No acute osseous abnormality identified.  IMPRESSION: 1. Hyperenhancing adenoids and tonsillar pillars consistent with acute tonsillitis. Associated small retropharyngeal effusion. No organized or drainable pharyngeal abscess at this time. Recommend repeat neck CT if the patient does not improve with treatment as expected. 2. Right greater than left reactive level 2 lymphadenopathy.   Electronically Signed   By: Augusto GambleLee  Hall M.D.   On: 10/24/2013 08:22     EKG Interpretation None      MDM   Final diagnoses:  Strep throat    6:46 AM Patient will have basic labs, IV fluids, and toradol. Patient is speaking in a "hot potato" voice and complaining of being unable  to swallow so I will also order a CT soft tissue neck to rule out abscess. Patient has an elevated temp at 100.86F and is tachycardic at 105.   8:49 AM Patient has elevated WBC and positive strep test. Patient will have IM pen G. CT neck shows no abscess at this time. Patient will be discharged with vicodin and zofran for symptoms. Patient instructed to follow up with PCP and return to the ED with worsening or concerning symptoms.     Emilia BeckKaitlyn Zoi Devine, [place] JerseyPA-C 10/24/13 [phone]

## 2013-10-26 NOTE — ED Provider Notes (Signed)
Medical screening examination/treatment/procedure(s) were performed by non-physician practitioner and as supervising physician I was immediately available for consultation/collaboration.   EKG Interpretation None       Laurie [name] M Dacota Ruben, MD 10/26/13 1736

## 2013-11-02 ENCOUNTER — Encounter (HOSPITAL_COMMUNITY): Payer: Self-pay | Admitting: Emergency Medicine

## 2013-11-02 ENCOUNTER — Emergency Department (HOSPITAL_COMMUNITY): Payer: PRIVATE HEALTH INSURANCE

## 2013-11-02 ENCOUNTER — Emergency Department (HOSPITAL_COMMUNITY)
Admission: EM | Admit: 2013-11-02 | Discharge: 2013-11-02 | Disposition: A | Discharge status: Home / Self Care | Payer: PRIVATE HEALTH INSURANCE | Attending: Emergency Medicine | Admitting: Emergency Medicine

## 2013-11-02 DIAGNOSIS — Z789 Other specified health status: Secondary | ICD-10-CM | POA: Insufficient documentation

## 2013-11-02 DIAGNOSIS — J45909 Unspecified asthma, uncomplicated: Secondary | ICD-10-CM | POA: Insufficient documentation

## 2013-11-02 DIAGNOSIS — Z8679 Personal history of other diseases of the circulatory system: Secondary | ICD-10-CM | POA: Insufficient documentation

## 2013-11-02 DIAGNOSIS — Z8669 Personal history of other diseases of the nervous system and sense organs: Secondary | ICD-10-CM | POA: Insufficient documentation

## 2013-11-02 DIAGNOSIS — R071 Chest pain on breathing: Secondary | ICD-10-CM | POA: Insufficient documentation

## 2013-11-02 DIAGNOSIS — Z8751 Personal history of pre-term labor: Secondary | ICD-10-CM | POA: Insufficient documentation

## 2013-11-02 DIAGNOSIS — Z79899 Other long term (current) drug therapy: Secondary | ICD-10-CM | POA: Insufficient documentation

## 2013-11-02 DIAGNOSIS — Z8619 Personal history of other infectious and parasitic diseases: Secondary | ICD-10-CM | POA: Insufficient documentation

## 2013-11-02 DIAGNOSIS — R0789 Other chest pain: Secondary | ICD-10-CM

## 2013-11-02 DIAGNOSIS — D649 Anemia, unspecified: Secondary | ICD-10-CM | POA: Insufficient documentation

## 2013-11-02 LAB — COMPREHENSIVE METABOLIC PANEL
ALK PHOS: 72 U/L (ref 39–117)
ALT: 13 U/L (ref 0–35)
AST: 21 U/L (ref 0–37)
Albumin: 3.9 g/dL (ref 3.5–5.2)
BUN: 15 mg/dL (ref 6–23)
CHLORIDE: 102 meq/L (ref 96–112)
CO2: 24 mEq/L (ref 19–32)
Calcium: 9.9 mg/dL (ref 8.4–10.5)
Creatinine, Ser: 0.76 mg/dL (ref 0.50–1.10)
GFR calc Af Amer: >90 mL/min (ref >90)
GFR calc non Af Amer: >90 mL/min (ref >90)
Glucose, Bld: 86 mg/dL (ref 70–99)
POTASSIUM: 4.4 meq/L (ref 3.7–5.3)
SODIUM: 139 meq/L (ref 137–147)
Total Protein: 8.3 g/dL (ref 6.0–8.3)

## 2013-11-02 LAB — CBC WITH DIFFERENTIAL/PLATELET
BASOS PCT: 1 % (ref 0–1)
Basophils Absolute: 0 10*3/uL (ref 0.0–0.1)
Eosinophils Absolute: 0.1 10*3/uL (ref 0.0–0.7)
Eosinophils Relative: 2 % (ref 0–5)
HEMATOCRIT: 39.2 % (ref 36.0–46.0)
Hemoglobin: 12.7 g/dL (ref 12.0–15.0)
LYMPHS ABS: 3.2 10*3/uL (ref 0.7–4.0)
Lymphocytes Relative: 53 % — ABNORMAL HIGH (ref 12–46)
MCH: 30.6 pg (ref 26.0–34.0)
MCHC: 32.4 g/dL (ref 30.0–36.0)
MCV: 94.5 fL (ref 78.0–100.0)
MONOS PCT: 7 % (ref 3–12)
Monocytes Absolute: 0.4 10*3/uL (ref 0.1–1.0)
NEUTROS ABS: 2.2 10*3/uL (ref 1.7–7.7)
NEUTROS PCT: 37 % — AB (ref 43–77)
Platelets: 301 10*3/uL (ref 150–400)
RBC: 4.15 MIL/uL (ref 3.87–5.11)
RDW: 14 % (ref 11.5–15.5)
WBC: 6 10*3/uL (ref 4.0–10.5)

## 2013-11-02 LAB — I-STAT TROPONIN, ED: TROPONIN I, POC: 0 ng/mL (ref 0.00–0.08)

## 2013-11-02 MED ORDER — IBUPROFEN 800 MG PO TABS: 800.0000 mg | ORAL_TABLET | Freq: Three times a day (TID) | ORAL | Status: DC | PRN

## 2013-11-02 NOTE — Discharge Instructions (Signed)

## 2013-11-02 NOTE — ED Notes (Signed)
Pt presents to department for evaluation of midsternal chest pain. Ongoing x2 days. 5/10 pain, becomes worse with deep breathing. Respirations unlabored. Pt is alert and oriented x4.

## 2013-11-02 NOTE — ED Provider Notes (Signed)
CSN: [id number]     Arrival date & time 11/02/13  1830 History   First MD [id number] Contact with Patient 11/02/13 2226     Chief Complaint  Patient presents with  . Chest Pain     (Consider location/radiation/quality/duration/timing/severity/associated sxs/prior Treatment) Patient is a 23 y.o. female presenting with chest pain.  Chest Pain  Pt reports 2 days of moderate aching midsternal chest pain, worse with movement and deep breath. No fever, cough or SOB. No PE or CAD risk factors. Recently had strep but has not been coughing much.   Past Medical History  Diagnosis Date  . Abnormal Pap smear     LAST PAP 09/2010  . Preterm labor 2011  . Anemia     CHILDHOOD  . Headache(784.0)     FREQUENT  . Asthma 2010  . Seizures 2009    WITH MIGRAINE  . SVD (spontaneous vaginal delivery) 12/16/2012  . Wears glasses   . Gonorrhea 07/2013   Past Surgical History  Procedure Laterality Date  . Appendectomy  07/22/2013  . Laparoscopic appendectomy N/A 07/22/2013    Procedure: APPENDECTOMY LAPAROSCOPIC;  Surgeon: Wilmon ArmsMatthew K. Corliss Skainssuei, MD;  Location: [facility] OR;  Service: General;  Laterality: N/A;  . Laparoscopy N/A 07/22/2013    Procedure: LAPAROSCOPY DIAGNOSTIC;  Surgeon: Wilmon ArmsMatthew K. Corliss Skainssuei, MD;  Location: [facility] OR;  Service: General;  Laterality: N/A;   Family History  Problem Relation Age of Onset  . Hypertension Father   . Asthma Father   . Asthma Brother   . Arthritis Paternal Aunt    History  Substance Use Topics  . Smoking status: Never Smoker   . Smokeless tobacco: Never Used  . Alcohol Use: Yes     Comment: 07/22/2013 "birthday parties, etc; a few times/yr"   OB History   Grav Para Term Preterm Abortions TAB SAB Ect Mult Living   3 3 2 1  0 0 0 0 0 3     Review of Systems  Cardiovascular: Positive for chest pain.   All other systems reviewed and are negative except as noted in HPI.     Allergies  Review of patient's allergies indicates no known allergies.  Home Medications    Prior to Admission medications   Medication Sig Start Date End Date Taking? Authorizing Provider  albuterol (PROVENTIL HFA;VENTOLIN HFA) 108 (90 BASE) MCG/ACT inhaler Inhale 2 puffs into the lungs every 6 (six) hours as needed for wheezing. 10/15/13  Yes Ronnald NianJohn C Lalonde, MD  Ferrous Gluconate 325 (36 FE) MG TABS Take 1 tablet by mouth 2 (two) times daily. 08/17/13  Yes David S Tysinger, PA-C   BP 107/60  Pulse 93  Temp(Src) 98.7 F (37.1 C) (Oral)  Resp 20  SpO2 100%  LMP 10/12/2013 Physical Exam  Nursing note and vitals reviewed. Constitutional: She is oriented to person, place, and time. She appears well-developed and well-nourished.  HENT:  Head: Normocephalic and atraumatic.  Eyes: EOM are normal. Pupils are equal, round, and reactive to light.  Neck: Normal range of motion. Neck supple.  Cardiovascular: Normal rate, normal heart sounds and intact distal pulses.   Pulmonary/Chest: Effort normal and breath sounds normal. She has no wheezes. She has no rales. She exhibits no tenderness.  Abdominal: Bowel sounds are normal. She exhibits no distension. There is no tenderness.  Musculoskeletal: Normal range of motion. She exhibits no edema and no tenderness.  Neurological: She is alert and oriented to person, place, and time. She has normal strength. No cranial nerve deficit  or sensory deficit.  Skin: Skin is warm and dry. No rash noted.  Psychiatric: She has a normal mood and affect.    ED Course  Procedures (including critical care time) Labs Review Labs Reviewed  CBC WITH DIFFERENTIAL - Abnormal; Notable for the following:    Neutrophils Relative % 37 (*)    Lymphocytes Relative 53 (*)    All other components within normal limits  COMPREHENSIVE METABOLIC PANEL - Abnormal; Notable for the following:    Total Bilirubin <0.2 (*)    All other components within normal limits  I-STAT TROPOININ, ED    Imaging Review Dg Chest 2 View  11/02/2013   CLINICAL DATA:  Chest pain  EXAM:  CHEST - 2 VIEW  COMPARISON:  DG CHEST 2 VIEW dated 07/18/2008  FINDINGS: Normal heart size. Clear lungs. No pneumothorax or pleural effusion.  IMPRESSION: No active cardiopulmonary disease.   Electronically Signed   By: Maryclare BeanArt  Hoss M.D.   On: 11/02/2013 22:17     EKG Interpretation   Date/Time:  Wednesday November 02 2013 18:35:39 EDT Ventricular Rate:  83 PR Interval:  150 QRS Duration: 68 QT Interval:  356 QTC Calculation: 418 R Axis:   84 Text Interpretation:  Normal sinus rhythm with sinus arrhythmia Normal ECG  No significant change since last tracing Confirmed by Oakley Kossman  MD, Leonette MostHARLES  [phone]) on 11/02/2013 10:05:33 PM      MDM   Final diagnoses:  Chest wall pain   Labs, EKG and CXR ordered in triage are normal. No concern for ACS, PE, PTX or other acute life threatening cardiac or pulmonary process. Likely costochondritis, pt has a young son she has been lifting. Advised NSAIDs, rest and PCP followup if not improving    [place] Brogden B. Bernette MayersSheldon, MD 11/02/13 2237

## 2013-11-02 NOTE — ED Notes (Signed)
Pt A&OX4, ambulatory at discharge with slow steady balanced gait, verbalizing no complaints at this time.

## 2013-12-06 ENCOUNTER — Encounter (HOSPITAL_COMMUNITY): Payer: Self-pay | Admitting: Emergency Medicine

## 2013-12-06 ENCOUNTER — Emergency Department (HOSPITAL_COMMUNITY)
Admission: EM | Admit: 2013-12-06 | Discharge: 2013-12-06 | Disposition: A | Discharge status: Home / Self Care | Payer: PRIVATE HEALTH INSURANCE | Attending: Emergency Medicine | Admitting: Emergency Medicine

## 2013-12-06 DIAGNOSIS — R1110 Vomiting, unspecified: Secondary | ICD-10-CM | POA: Insufficient documentation

## 2013-12-06 DIAGNOSIS — Z91018 Allergy to other foods: Secondary | ICD-10-CM | POA: Insufficient documentation

## 2013-12-06 DIAGNOSIS — Z8619 Personal history of other infectious and parasitic diseases: Secondary | ICD-10-CM | POA: Insufficient documentation

## 2013-12-06 DIAGNOSIS — Z79899 Other long term (current) drug therapy: Secondary | ICD-10-CM | POA: Insufficient documentation

## 2013-12-06 DIAGNOSIS — R197 Diarrhea, unspecified: Secondary | ICD-10-CM | POA: Insufficient documentation

## 2013-12-06 DIAGNOSIS — R1013 Epigastric pain: Secondary | ICD-10-CM | POA: Insufficient documentation

## 2013-12-06 DIAGNOSIS — J45909 Unspecified asthma, uncomplicated: Secondary | ICD-10-CM | POA: Insufficient documentation

## 2013-12-06 DIAGNOSIS — E860 Dehydration: Secondary | ICD-10-CM

## 2013-12-06 DIAGNOSIS — Z789 Other specified health status: Secondary | ICD-10-CM | POA: Insufficient documentation

## 2013-12-06 DIAGNOSIS — R42 Dizziness and giddiness: Secondary | ICD-10-CM | POA: Insufficient documentation

## 2013-12-06 LAB — CBC WITH DIFFERENTIAL/PLATELET
Basophils Absolute: 0 10*3/uL (ref 0.0–0.1)
Basophils Relative: 0 % (ref 0–1)
EOS ABS: 0.1 10*3/uL (ref 0.0–0.7)
Eosinophils Relative: 4 % (ref 0–5)
HCT: 32.2 % — ABNORMAL LOW (ref 36.0–46.0)
Hemoglobin: 10.8 g/dL — ABNORMAL LOW (ref 12.0–15.0)
LYMPHS ABS: 1.5 10*3/uL (ref 0.7–4.0)
LYMPHS PCT: 50 % — AB (ref 12–46)
MCH: 31.4 pg (ref 26.0–34.0)
MCHC: 33.5 g/dL (ref 30.0–36.0)
MCV: 93.6 fL (ref 78.0–100.0)
MONO ABS: 0.4 10*3/uL (ref 0.1–1.0)
Monocytes Relative: 14 % — ABNORMAL HIGH (ref 3–12)
NEUTROS PCT: 32 % — AB (ref 43–77)
Neutro Abs: 0.9 10*3/uL — ABNORMAL LOW (ref 1.7–7.7)
Platelets: 206 10*3/uL (ref 150–400)
RBC: 3.44 MIL/uL — ABNORMAL LOW (ref 3.87–5.11)
RDW: 13.4 % (ref 11.5–15.5)
WBC: 2.9 10*3/uL — AB (ref 4.0–10.5)

## 2013-12-06 LAB — URINALYSIS, ROUTINE W REFLEX MICROSCOPIC
Glucose, UA: NEGATIVE mg/dL
HGB URINE DIPSTICK: NEGATIVE
Ketones, ur: 15 mg/dL — AB
Nitrite: NEGATIVE
PROTEIN: 100 mg/dL — AB
Urobilinogen, UA: 1 mg/dL (ref 0.0–1.0)
pH: 6 (ref 5.0–8.0)

## 2013-12-06 LAB — URINE MICROSCOPIC-ADD ON

## 2013-12-06 LAB — COMPREHENSIVE METABOLIC PANEL
ALBUMIN: 3.4 g/dL — AB (ref 3.5–5.2)
ALK PHOS: 65 U/L (ref 39–117)
ALT: 15 U/L (ref 0–35)
AST: 25 U/L (ref 0–37)
BUN: 12 mg/dL (ref 6–23)
CHLORIDE: 106 meq/L (ref 96–112)
CO2: 24 meq/L (ref 19–32)
CREATININE: 0.69 mg/dL (ref 0.50–1.10)
Calcium: 8.4 mg/dL (ref 8.4–10.5)
GFR calc Af Amer: >90 mL/min (ref >90)
Glucose, Bld: 90 mg/dL (ref 70–99)
POTASSIUM: 3.8 meq/L (ref 3.7–5.3)
Sodium: 139 mEq/L (ref 137–147)
Total Protein: 6.5 g/dL (ref 6.0–8.3)

## 2013-12-06 LAB — LIPASE, BLOOD: Lipase: 33 U/L (ref 11–59)

## 2013-12-06 LAB — PATHOLOGIST SMEAR REVIEW

## 2013-12-06 LAB — PREGNANCY, URINE: Preg Test, Ur: NEGATIVE

## 2013-12-06 MED ORDER — FENTANYL CITRATE 0.05 MG/ML IJ SOLN
50.0000 ug | Freq: Once | INTRAMUSCULAR | Status: AC
  Administered 2013-12-06: 50 ug via INTRAVENOUS
  Filled 2013-12-06: qty 2

## 2013-12-06 MED ORDER — ONDANSETRON 4 MG PO TBDP: ORAL_TABLET | ORAL | Status: DC

## 2013-12-06 MED ORDER — ONDANSETRON HCL 4 MG/2ML IJ SOLN
4.0000 mg | Freq: Once | INTRAMUSCULAR | Status: AC
  Administered 2013-12-06: 4 mg via INTRAVENOUS
  Filled 2013-12-06: qty 2

## 2013-12-06 MED ORDER — KETOROLAC TROMETHAMINE 15 MG/ML IJ SOLN: 15.0000 mg | Freq: Once | INTRAMUSCULAR | Status: DC

## 2013-12-06 MED ORDER — SODIUM CHLORIDE 0.9 % IV BOLUS (SEPSIS)
1000.0000 mL | Freq: Once | INTRAVENOUS | Status: AC
  Administered 2013-12-06: 1000 mL via INTRAVENOUS

## 2013-12-06 NOTE — Discharge Instructions (Signed)
If you were given medicines take as directed.  If you are on coumadin or contraceptives realize their levels and effectiveness is altered by many different medicines.  If you have any reaction (rash, tongues swelling, other) to the medicines stop taking and see a physician.   Please follow up as directed and return to the ER or see a physician for new or worsening symptoms.  Thank you. Filed Vitals:   12/06/13 0530 12/06/13 0545 12/06/13 0600  BP: 106/67 101/59 104/76  Pulse: 68 66 59  Temp: 99.1 F (37.3 C)    TempSrc: Oral    Resp: 17    Height: 5\' 5"  (1.651 m)    Weight: 120 lb (54.432 kg)    SpO2: 98% 100% 100%

## 2013-12-06 NOTE — ED Notes (Signed)
Unable to give urine specimen at this time .  

## 2013-12-06 NOTE — ED Notes (Signed)
Pt. woke up this morning with intermittent mid abdominal cramping and slight nausea , denies urinary discomfort , no fever or chills.

## 2013-12-06 NOTE — ED Provider Notes (Signed)
CSN: [id number]     Arrival date & time 12/06/13  0518 History   First MD Initiated Contact with Patient 12/06/13 [phone]     Chief Complaint  Patient presents with  . Abdominal Pain     (Consider location/radiation/quality/duration/timing/severity/associated sxs/prior Treatment) HPI Comments: 23 year old female with asthma history presents with recurrent epigastric and central abdominal pain and vomiting since Thursday. Mild soft stools started today. Cramping sensation. Worse after eating solids and liquids. Not specifically worse after eating fatty foods. No known gallbladder problems. Patient had appendix removed in January of this year. No sick contacts or recent antibiotics. No travel recently. Symptoms intermittent Patient currently on menstrual period  Patient is a 23 y.o. female presenting with abdominal pain. The history is provided by the patient.  Abdominal Pain Associated symptoms: diarrhea, nausea and vomiting   Associated symptoms: no chest pain, no chills, no dysuria, no fever and no shortness of breath     Past Medical History  Diagnosis Date  . Abnormal Pap smear     LAST PAP 09/2010  . Preterm labor 2011  . Anemia     CHILDHOOD  . Headache(784.0)     FREQUENT  . Asthma 2010  . Seizures 2009    WITH MIGRAINE  . SVD (spontaneous vaginal delivery) 12/16/2012  . Wears glasses   . Gonorrhea 07/2013   Past Surgical History  Procedure Laterality Date  . Appendectomy  07/22/2013  . Laparoscopic appendectomy N/A 07/22/2013    Procedure: APPENDECTOMY LAPAROSCOPIC;  Surgeon: Wilmon Arms. Corliss Skains, MD;  Location: [facility] OR;  Service: General;  Laterality: N/A;  . Laparoscopy N/A 07/22/2013    Procedure: LAPAROSCOPY DIAGNOSTIC;  Surgeon: Wilmon Arms. Corliss Skains, MD;  Location: [facility] OR;  Service: General;  Laterality: N/A;   Family History  Problem Relation Age of Onset  . Hypertension Father   . Asthma Father   . Asthma Brother   . Arthritis Paternal Aunt    History  Substance Use Topics   . Smoking status: Never Smoker   . Smokeless tobacco: Never Used  . Alcohol Use: Yes     Comment: 07/22/2013 "birthday parties, etc; a few times/yr"   OB History   Grav Para Term Preterm Abortions TAB SAB Ect Mult Living   3 3 2 1  0 0 0 0 0 3     Review of Systems  Constitutional: Negative for fever and chills.  HENT: Negative for congestion.   Eyes: Negative for visual disturbance.  Respiratory: Negative for shortness of breath.   Cardiovascular: Negative for chest pain.  Gastrointestinal: Positive for nausea, vomiting, abdominal pain and diarrhea. Negative for blood in stool.  Genitourinary: Negative for dysuria and flank pain.  Musculoskeletal: Negative for back pain, neck pain and neck stiffness.  Skin: Negative for rash.  Neurological: Positive for light-headedness. Negative for headaches.      Allergies  Pineapple extract  Home Medications   Prior to Admission medications   Medication Sig Start Date End Date Taking? Authorizing Provider  albuterol (PROVENTIL HFA;VENTOLIN HFA) 108 (90 BASE) MCG/ACT inhaler Inhale 2 puffs into the lungs every 6 (six) hours as needed for wheezing. 10/15/13  Yes Ronnald Nian, MD  ibuprofen (ADVIL,MOTRIN) 800 MG tablet Take 800 mg by mouth every 8 (eight) hours as needed for moderate pain.   Yes Historical Provider, MD   BP 104/76  Pulse 59  Temp(Src) 99.1 F (37.3 C) (Oral)  Resp 17  Ht 5\' 5"  (1.651 m)  Wt 120 lb (54.432 kg)  BMI 19.97 kg/m2  SpO2 100%  LMP 12/02/2013 Physical Exam  Nursing note and vitals reviewed. Constitutional: She is oriented to person, place, and time. She appears well-developed and well-nourished.  HENT:  Head: Normocephalic and atraumatic.  Mild dry mucous membranes  Eyes: Conjunctivae are normal. Right eye exhibits no discharge. Left eye exhibits no discharge.  Neck: Normal range of motion. Neck supple. No tracheal deviation present.  Cardiovascular: Normal rate and regular rhythm.   Pulmonary/Chest:  Effort normal and breath sounds normal.  Abdominal: Soft. She exhibits no distension. There is tenderness (mild central and epigastric). There is no guarding.  Musculoskeletal: She exhibits no edema.  Neurological: She is alert and oriented to person, place, and time.  Skin: Skin is warm. No rash noted.  Psychiatric: She has a normal mood and affect.    ED Course  Procedures (including critical care time)  EMERGENCY DEPARTMENT BILIARY ULTRASOUND INTERPRETATION "Study: Limited Abdominal Ultrasound of the gallbladder and common bile duct."  INDICATIONS: Abdominal pain and Nausea Indication: Multiple views of the gallbladder and common bile duct were obtained in real-time with a Multi-frequency probe." PERFORMED BY:  Myself IMAGES ARCHIVED?: Yes FINDINGS: Gallstones absent, Gallbladder wall normal in thickness, Sonographic Murphy's sign absent and Common bile duct normal in size LIMITATIONS: Bowel Gas INTERPRETATION: Normal   Labs Review Labs Reviewed  URINALYSIS, ROUTINE W REFLEX MICROSCOPIC - Abnormal; Notable for the following:    Color, Urine AMBER (*)    Specific Gravity, Urine >1.030 (*)    Bilirubin Urine SMALL (*)    Ketones, ur 15 (*)    Protein, ur 100 (*)    Leukocytes, UA TRACE (*)    All other components within normal limits  CBC WITH DIFFERENTIAL - Abnormal; Notable for the following:    RBC 3.44 (*)    Hemoglobin 10.8 (*)    HCT 32.2 (*)    All other components within normal limits  COMPREHENSIVE METABOLIC PANEL - Abnormal; Notable for the following:    Albumin 3.4 (*)    Total Bilirubin <0.2 (*)    All other components within normal limits  URINE MICROSCOPIC-ADD ON - Abnormal; Notable for the following:    Squamous Epithelial / LPF FEW (*)    Bacteria, UA FEW (*)    Crystals CA OXALATE CRYSTALS (*)    All other components within normal limits  PREGNANCY, URINE  LIPASE, BLOOD    Imaging Review No results found.   EKG Interpretation None      MDM    Final diagnoses:  None   Clinical differential gastroenteritis, urine infection, gallbladder related, other. IV fluids and Zofran given in ED. Patient proved. Bedside ultrasound performed gallbladder unremarkable.  On recheck patient improved significantly, smiling in no pain. CBC pending. Plan for discharge and less significant abnormality found in CBC.  Dehydration, epigastric pain, vomiting    Enid SkeensJoshua M Mariateresa Batra, MD 12/06/13 [phone]

## 2013-12-30 IMAGING — US US FETAL BPP W/O NONSTRESS
1 series · 13 of 22 positions shown · non-contrast
Comparison: none

[Series 1: us fetal bpp w/o nonstress · non-contrast · 22 acquisitions, 13 frames shown]
[im 1/22]
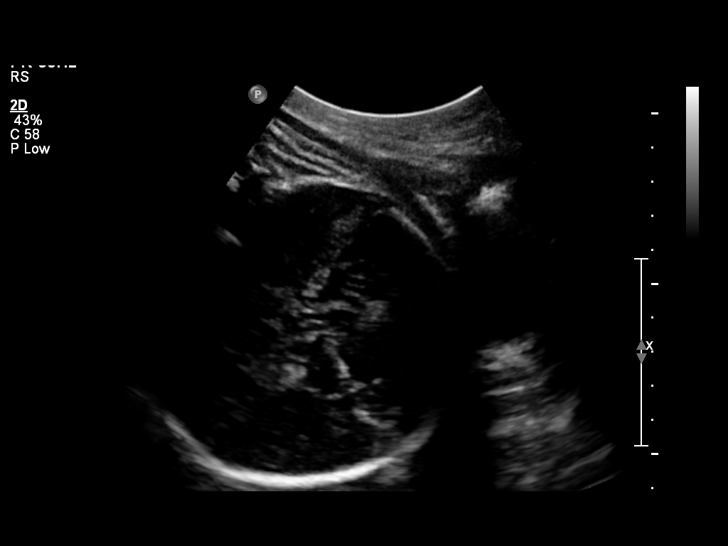
[im 3/22]
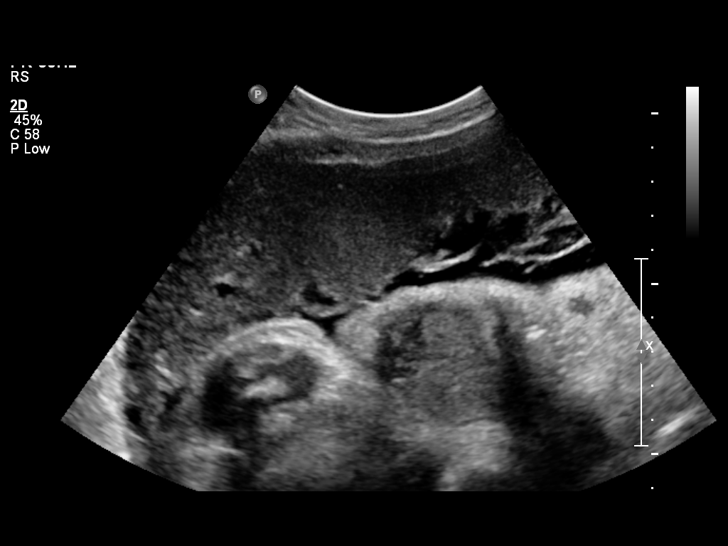
[im 5/22]
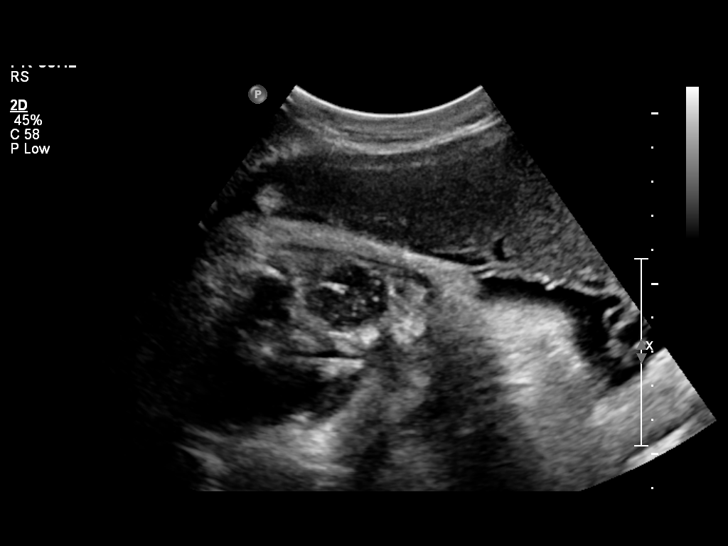
[im 6/22]
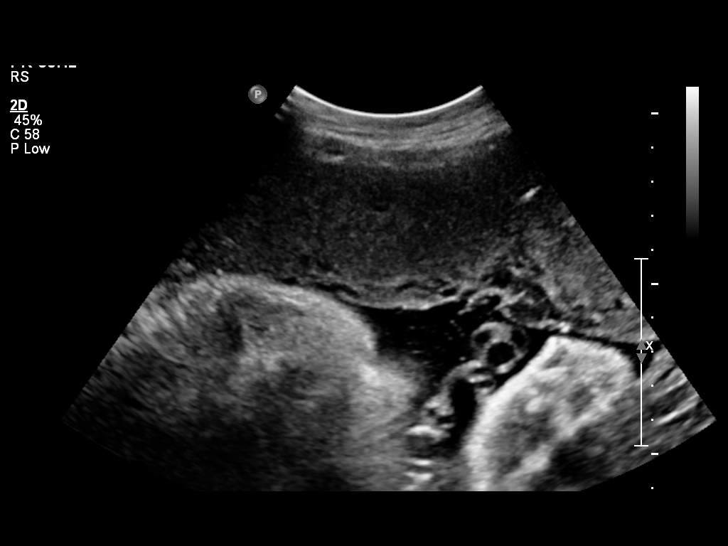
[im 8/22]
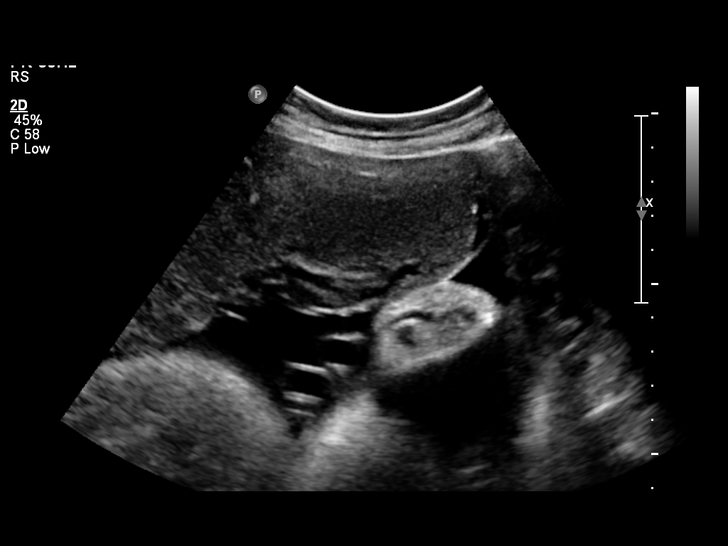
[im 10/22]
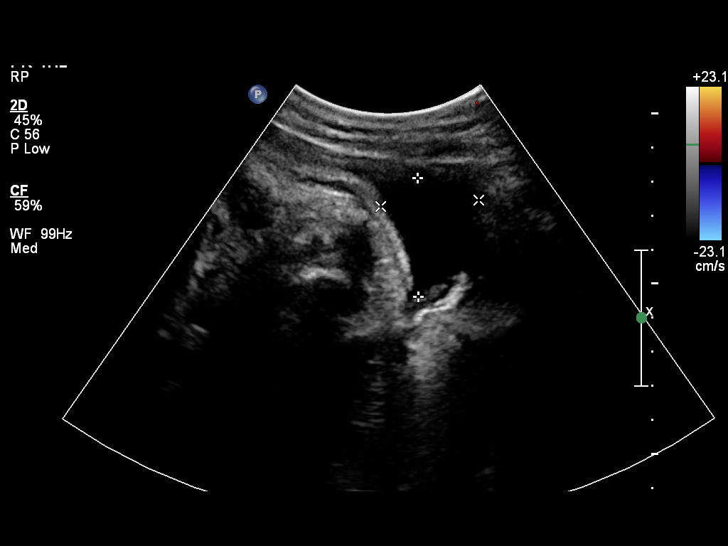
[im 12/22]
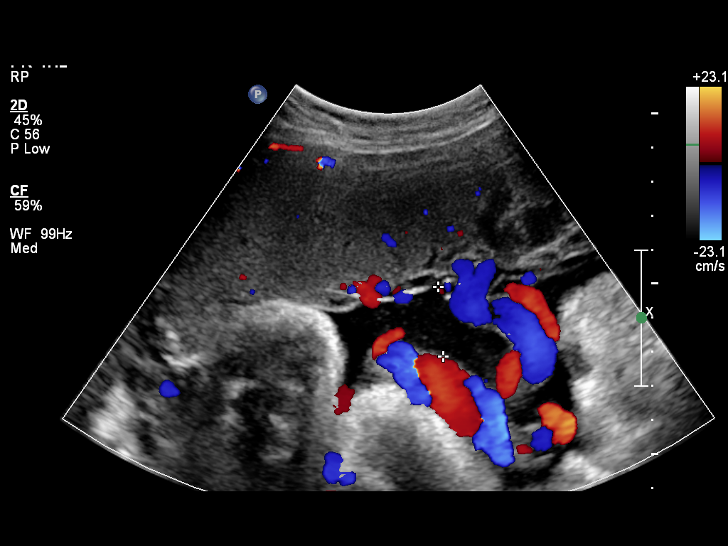
[im 13/22]
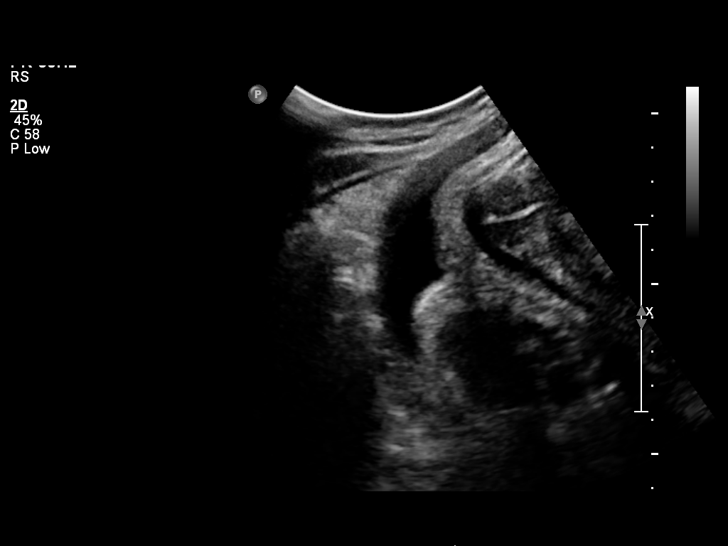
[im 15/22]
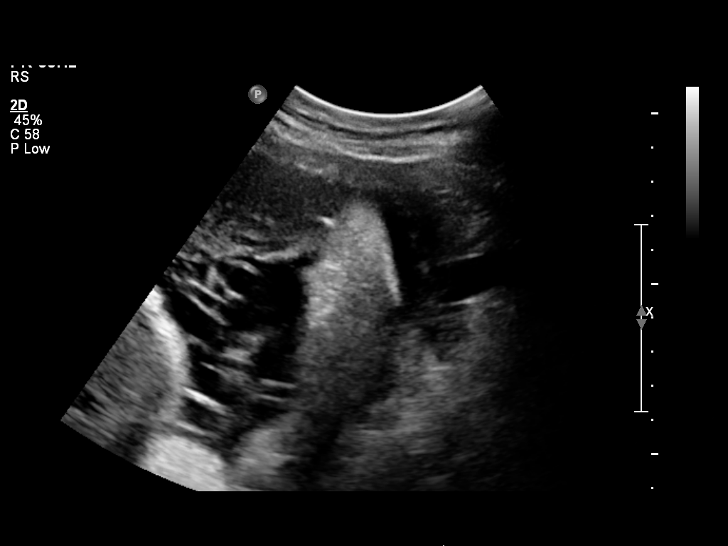
[im 17/22]
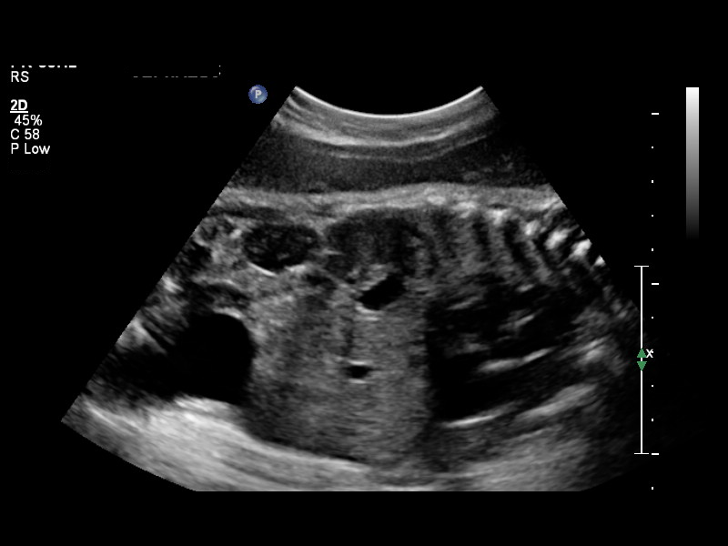
[im 18/22]
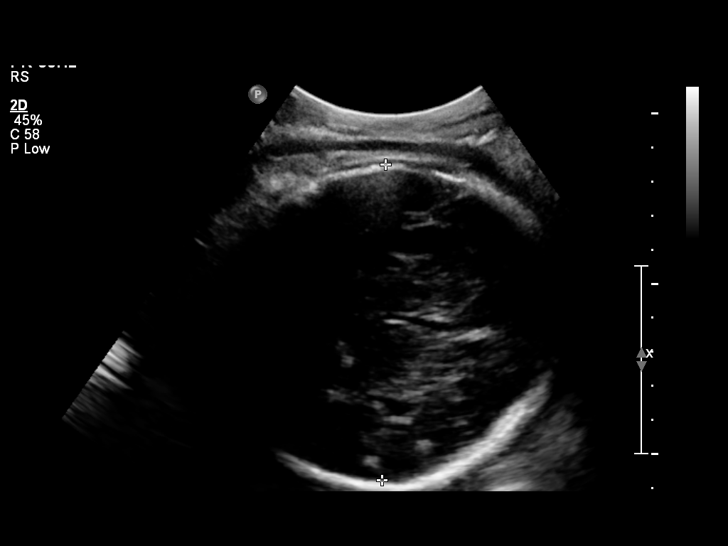
[im 20/22]
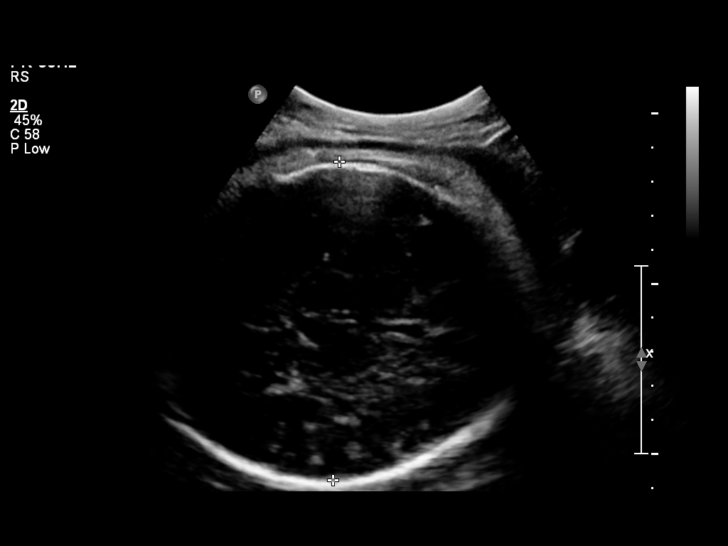
[im 22/22]
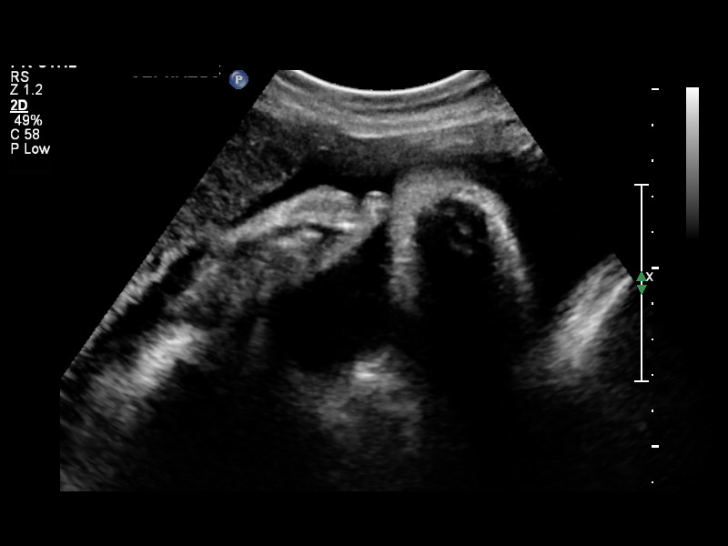

[13 of 22 positions shown; findings below may reference images not displayed]

OBSTETRICS REPORT
                      (Signed Final 12/15/2012 [DATE])

             ROBERTSSILAVONG

Service(s) Provided

 [HOSPITAL]                                         76815.0
Indications

 Vaginal bleeding, unknown etiology
 Poor obstetric history: Previous fetal growth
 restriction (FGR)
 Poor obstetric history: Previous preterm delivery
 Non-reactive NST

Fetal Evaluation

 Num Of Fetuses:    1
 Fetal Heart Rate:  139                         bpm
 Cardiac Activity:  Observed
 Presentation:      Cephalic
 Placenta:          Anterior, above cervical os

 Amniotic Fluid
 AFI FV:      Subjectively within normal limits
 AFI Sum:     13.44   cm      55   %Tile     Larg Pckt:   5.44   cm
 RUQ:   2.49   cm    RLQ:    3.47   cm    LUQ:   2.04    cm   LLQ:    5.44   cm
Biophysical Evaluation

 Amniotic F.V:   Pocket => 2 cm two         F. Tone:        Not Observed
                 planes
 F. Movement:    Not Observed               Score:          [DATE]
 F. Breathing:   Observed
Biometry

 BPD:     93.2  mm    G. Age:   37w 6d
Gestational Age

 Clinical EDD:  39w 4d                                        EDD:   12/18/12
 U/S Today:     37w 6d                                        EDD:   12/30/12
 Best:          39w 4d    Det. By:   Clinical EDD             EDD:   12/18/12
Cervix Uterus Adnexa
 Cervix:       Not visualized (advanced GA >34 wks)
 Left Ovary:   Not visualized.
 Right Ovary:  Not visualized.

 Adnexa:     No abnormality visualized.
Impression

 Single live IUP in cephalic presentation.
 BPP [DATE] (no score awarded for fetal movement or tone).
 Subjectively and quantitatively normal amniotic fluid volume.
 Critical value called to Shamizah Kornain by Dr. Mada 12/15/12 at
 642 pm.

 ROBERTSSILAVONG with us.  Please do not hesitate to

## 2014-05-15 ENCOUNTER — Encounter (HOSPITAL_COMMUNITY): Payer: Self-pay | Admitting: Emergency Medicine

## 2014-07-14 NOTE — L&D Delivery Note (Signed)
Delivery Note At 4:14, she was noted to be 10/10/+1.  She pushed well with two contractions, and at 4:25 AM a viable female was delivered via Vaginal, Spontaneous Delivery (Presentation: ; Occiput Anterior).  APGAR: 9, 9; weight  .   Placenta status: Intact, Spontaneous Pathology.  Cord: 3 vessels with the following complications: None.  Cord pH: n/a  Anesthesia: Epidural  Episiotomy: None Lacerations: None Suture Repair: None Est. Blood Loss (mL): 250  Mom to postpartum.  Baby to Couplet care / Skin to Skin.  Laurie [name] GEFFEL Laurie [name] 10/26/2014, 4:55 AM

## 2014-07-29 ENCOUNTER — Encounter (HOSPITAL_COMMUNITY): Payer: Self-pay | Admitting: *Deleted

## 2014-07-29 ENCOUNTER — Emergency Department (HOSPITAL_COMMUNITY)
Admission: EM | Admit: 2014-07-29 | Discharge: 2014-07-30 | Disposition: A | Discharge status: Home / Self Care | Payer: Managed Care, Other (non HMO) | Attending: Emergency Medicine | Admitting: Emergency Medicine

## 2014-07-29 DIAGNOSIS — R109 Unspecified abdominal pain: Secondary | ICD-10-CM | POA: Diagnosis not present

## 2014-07-29 DIAGNOSIS — O23592 Infection of other part of genital tract in pregnancy, second trimester: Secondary | ICD-10-CM | POA: Insufficient documentation

## 2014-07-29 DIAGNOSIS — Z8751 Personal history of pre-term labor: Secondary | ICD-10-CM | POA: Insufficient documentation

## 2014-07-29 DIAGNOSIS — Z79899 Other long term (current) drug therapy: Secondary | ICD-10-CM | POA: Diagnosis not present

## 2014-07-29 DIAGNOSIS — Z8619 Personal history of other infectious and parasitic diseases: Secondary | ICD-10-CM | POA: Insufficient documentation

## 2014-07-29 DIAGNOSIS — O99512 Diseases of the respiratory system complicating pregnancy, second trimester: Secondary | ICD-10-CM | POA: Diagnosis not present

## 2014-07-29 DIAGNOSIS — Z3A25 25 weeks gestation of pregnancy: Secondary | ICD-10-CM | POA: Diagnosis not present

## 2014-07-29 DIAGNOSIS — R102 Pelvic and perineal pain: Secondary | ICD-10-CM | POA: Diagnosis not present

## 2014-07-29 DIAGNOSIS — J45909 Unspecified asthma, uncomplicated: Secondary | ICD-10-CM | POA: Insufficient documentation

## 2014-07-29 DIAGNOSIS — Z973 Presence of spectacles and contact lenses: Secondary | ICD-10-CM | POA: Insufficient documentation

## 2014-07-29 DIAGNOSIS — O9989 Other specified diseases and conditions complicating pregnancy, childbirth and the puerperium: Secondary | ICD-10-CM | POA: Diagnosis present

## 2014-07-29 DIAGNOSIS — Z862 Personal history of diseases of the blood and blood-forming organs and certain disorders involving the immune mechanism: Secondary | ICD-10-CM | POA: Diagnosis not present

## 2014-07-29 DIAGNOSIS — O26899 Other specified pregnancy related conditions, unspecified trimester: Secondary | ICD-10-CM

## 2014-07-29 DIAGNOSIS — N760 Acute vaginitis: Secondary | ICD-10-CM

## 2014-07-29 DIAGNOSIS — B9689 Other specified bacterial agents as the cause of diseases classified elsewhere: Secondary | ICD-10-CM

## 2014-07-29 LAB — CBC WITH DIFFERENTIAL/PLATELET
BASOS ABS: 0 10*3/uL (ref 0.0–0.1)
BASOS PCT: 0 % (ref 0–1)
EOS ABS: 0.1 10*3/uL (ref 0.0–0.7)
Eosinophils Relative: 2 % (ref 0–5)
HCT: 33.6 % — ABNORMAL LOW (ref 36.0–46.0)
Hemoglobin: 11.2 g/dL — ABNORMAL LOW (ref 12.0–15.0)
LYMPHS PCT: 32 % (ref 12–46)
Lymphs Abs: 2.1 10*3/uL (ref 0.7–4.0)
MCH: 30.8 pg (ref 26.0–34.0)
MCHC: 33.3 g/dL (ref 30.0–36.0)
MCV: 92.3 fL (ref 78.0–100.0)
Monocytes Absolute: 0.7 10*3/uL (ref 0.1–1.0)
Monocytes Relative: 10 % (ref 3–12)
Neutro Abs: 3.7 10*3/uL (ref 1.7–7.7)
Neutrophils Relative %: 56 % (ref 43–77)
Platelets: 216 10*3/uL (ref 150–400)
RBC: 3.64 MIL/uL — AB (ref 3.87–5.11)
RDW: 12.8 % (ref 11.5–15.5)
WBC: 6.6 10*3/uL (ref 4.0–10.5)

## 2014-07-29 LAB — URINALYSIS, ROUTINE W REFLEX MICROSCOPIC
BILIRUBIN URINE: NEGATIVE
Glucose, UA: NEGATIVE mg/dL
Hgb urine dipstick: NEGATIVE
KETONES UR: NEGATIVE mg/dL
Nitrite: NEGATIVE
Protein, ur: NEGATIVE mg/dL
Specific Gravity, Urine: 1.011 (ref 1.005–1.030)
UROBILINOGEN UA: 1 mg/dL (ref 0.0–1.0)
pH: 6.5 (ref 5.0–8.0)

## 2014-07-29 LAB — PREGNANCY, URINE: Preg Test, Ur: POSITIVE — AB

## 2014-07-29 LAB — URINE MICROSCOPIC-ADD ON

## 2014-07-29 NOTE — ED Notes (Signed)
Pt's last menstrual period approx 2 months ago

## 2014-07-29 NOTE — ED Notes (Signed)
The pt is c/o abd pain for 2-3 months no n v.  lmp 3 months.  She does not know if she is pregnant

## 2014-07-30 ENCOUNTER — Encounter (HOSPITAL_COMMUNITY): Payer: Self-pay | Admitting: *Deleted

## 2014-07-30 ENCOUNTER — Emergency Department (HOSPITAL_COMMUNITY): Payer: Managed Care, Other (non HMO)

## 2014-07-30 ENCOUNTER — Inpatient Hospital Stay (HOSPITAL_COMMUNITY)
Admission: AD | Admit: 2014-07-30 | Discharge: 2014-07-30 | Disposition: A | Discharge status: Home / Self Care | Payer: Managed Care, Other (non HMO) | Source: Ambulatory Visit | Attending: Obstetrics & Gynecology | Admitting: Obstetrics & Gynecology

## 2014-07-30 DIAGNOSIS — R109 Unspecified abdominal pain: Secondary | ICD-10-CM | POA: Insufficient documentation

## 2014-07-30 DIAGNOSIS — O9989 Other specified diseases and conditions complicating pregnancy, childbirth and the puerperium: Secondary | ICD-10-CM

## 2014-07-30 DIAGNOSIS — Z3A24 24 weeks gestation of pregnancy: Secondary | ICD-10-CM

## 2014-07-30 LAB — WET PREP, GENITAL
Trich, Wet Prep: NONE SEEN
Yeast Wet Prep HPF POC: NONE SEEN

## 2014-07-30 LAB — HCG, QUANTITATIVE, PREGNANCY: HCG, BETA CHAIN, QUANT, S: 3556 m[IU]/mL — AB (ref <5)

## 2014-07-30 LAB — COMPREHENSIVE METABOLIC PANEL
ALT: 10 U/L (ref 0–35)
AST: 21 U/L (ref 0–37)
Albumin: 3.1 g/dL — ABNORMAL LOW (ref 3.5–5.2)
Alkaline Phosphatase: 58 U/L (ref 39–117)
Anion gap: 9 (ref 5–15)
BILIRUBIN TOTAL: 0.3 mg/dL (ref 0.3–1.2)
CO2: 21 mmol/L (ref 19–32)
Calcium: 8.5 mg/dL (ref 8.4–10.5)
Chloride: 104 mEq/L (ref 96–112)
Creatinine, Ser: 0.57 mg/dL (ref 0.50–1.10)
GFR calc non Af Amer: >90 mL/min (ref >90)
Glucose, Bld: 92 mg/dL (ref 70–99)
Potassium: 3.2 mmol/L — ABNORMAL LOW (ref 3.5–5.1)
SODIUM: 134 mmol/L — AB (ref 135–145)
Total Protein: 6.6 g/dL (ref 6.0–8.3)

## 2014-07-30 LAB — ABO/RH: ABO/RH(D): O POS

## 2014-07-30 LAB — LIPASE, BLOOD: LIPASE: 30 U/L (ref 11–59)

## 2014-07-30 MED ORDER — METRONIDAZOLE 500 MG PO TABS: 500.0000 mg | ORAL_TABLET | Freq: Two times a day (BID) | ORAL | Status: DC

## 2014-07-30 MED ORDER — PRENATAL COMPLETE 14-0.4 MG PO TABS: 1.0000 | ORAL_TABLET | Freq: Every day | ORAL | Status: DC

## 2014-07-30 NOTE — Discharge Instructions (Signed)
Risks of Alcohol Use in Pregnancy Alcohol is the biggest cause of mental retardation in babies. Women who are pregnant or who may become pregnant should not drink alcohol. This will lessen the chance of giving birth to a baby with any of the harmful effects of FASD (Fetal Alcohol Spectrum Disorders). FASD is the full spectrum of birth defects caused by drinking while pregnant. The spectrum may include mild changes, such as a slight learning disability. Or it could be full FAS (Fetal Alcohol Syndrome). FAS is a permanent condition that can include: severe learning disabilities, decreased growth (growth deficiencies), abnormal facial features, brain (central nervous system) disorders, and behavior problems. It is unsafe to drink any amount of alcohol when pregnant. When the mother drinks alcohol, so does the baby. The amount of alcohol in the mother's blood shows up as the same amount found in the baby's blood. Alcohol is digested by the mature liver in the mother, but the liver is not mature in the baby. As a result, alcohol affects the baby much more than it affects the mother. The [employer]stetricians and Gynecologists recommend the safest way to prevent problems to the baby is to not drink any amount of alcohol when pregnant. One of the most preventable causes of a baby developing FASD, especially FAS, is drinking alcohol. FAS is a lifelong physically and mentally disabling condition. The safest thing to do is not drink alcohol when pregnant. RISKS  Alcohol consumed during pregnancy increases the risk of alcohol related birth defects. These include:  Central nervous system disorders.  Decreased growth.  Delayed intellectual development.  Behavioral disorders.  Abnormal facial features.  Mental retardation.  Sleep and sucking disorders.  Children with FAS are at risk of:  Having psychiatric problems.  Criminal behavior.  Unemployment.  Not finishing their education.  No  amount of alcohol can be considered safe during pregnancy.  Alcohol can damage a fetus at any stage of pregnancy. Damage can occur in the early weeks of pregnancy. This is even before a woman knows that she is pregnant.  The learning (cognitive) and behavior problems due to prenatal alcohol exposure are lifelong.  Alcohol-related birth defects are completely preventable.  There is an increased risk of miscarriage and fetal death.  Fetal growth retardation.  Increase in accidents at home, away, and driving.  Drinking alcohol can easily lead to abuse of drugs and smoking. SYMPTOMS  Drinking alcohol slows down the function of your body and brain. It negatively affects your:  Walking (unstable).  Hearing.  Vision.  Daily activities.  Driving.  Mind and memory.  Talking (slurred speech).  It contributes to bad diet and nutrition, which are important for the growth and development of the baby.  In time, it will also cause problems with your health, your relationship with your family, friends, and work. DIAGNOSIS   Do not wait for symptoms to develop to make the diagnosis.  At the first prenatal office visit, the caregiver and patient should discuss alcohol drinking when pregnant.  If the mother is drinking alcohol, both the caregiver and patient should make a serious attempt to stop the drinking completely. TREATMENT   The best treatment is to not drink or stop drinking.  If someone needs help to stop drinking, she should contact the local Alcoholics Anonymous chapter. Or contact a treatment center. The Substance Abuse and Mental Health Services [facility](SAMHSA) can help people find local centers in their area.  Intervention by the spouse, family, and friends.  Counseling by pastor, psychologist, or psychiatrist.  Hospitalization may be necessary.  If the spouse drinks alcohol, he should stop. HOME CARE INSTRUCTIONS   A woman should not drink alcohol while she is  pregnant.  A pregnant woman who has already consumed alcohol during her pregnancy should stop immediately. This will reduce further risk to the baby.  A woman who is considering becoming pregnant should not drink alcohol.  Women of child-bearing age should talk to their caregivers about this issue. Then take steps to reduce the chance of drinking alcohol while pregnant.  The father plays an important role in helping the mother abstain from drinking alcohol. He can encourage her to abstain and should abstain himself.  Avoiding social events where alcohol is served will help, too.  If you think your baby has FASD or FAS, call your caregiver. FOR MORE INFORMATION: The [employer] on Alcohol Abuse and Alcoholism, NIH: BasicStudents.dk The Substance Abuse and Mental Health Services Administration: www.fascenter.RockToxic.pl Document Released: 04/27/2007 Document Revised: 09/22/2011 Document Reviewed: 09/01/2013 [facility] Patient Information 2015 [place], [place]. This information is not intended to replace advice given to you by your health care provider. Make sure you discuss any questions you have with your health care provider.  Second Trimester of Pregnancy The second trimester is from week 13 through week 28, month 4 through 6. This is often the time in pregnancy that you feel your best. Often times, morning sickness has lessened or quit. You may have more energy, and you may get hungry more often. Your unborn baby (fetus) is growing rapidly. At the end of the sixth month, he or she is about 9 inches long and weighs about 1 pounds. You will likely feel the baby move (quickening) between 18 and 20 weeks of pregnancy. HOME CARE   Avoid all smoking, herbs, and alcohol. Avoid drugs not approved by your doctor.  Only take medicine as told by your doctor. Some medicines are safe and some are not during pregnancy.  Exercise only as told by your doctor. Stop exercising if you start having  cramps.  Eat regular, healthy meals.  Wear a good support bra if your breasts are tender.  Do not use hot tubs, steam rooms, or saunas.  Wear your seat belt when driving.  Avoid raw meat, uncooked cheese, and liter boxes and soil used by cats.  Take your prenatal vitamins.  Try taking medicine that helps you poop (stool softener) as needed, and if your doctor approves. Eat more fiber by eating fresh fruit, vegetables, and whole grains. Drink enough fluids to keep your pee (urine) clear or pale yellow.  Take warm water baths (sitz baths) to soothe pain or discomfort caused by hemorrhoids. Use hemorrhoid cream if your doctor approves.  If you have puffy, bulging veins (varicose veins), wear support hose. Raise (elevate) your feet for 15 minutes, 3-4 times a day. Limit salt in your diet.  Avoid heavy lifting, wear low heals, and sit up straight.  Rest with your legs raised if you have leg cramps or low back pain.  Visit your dentist if you have not gone during your pregnancy. Use a soft toothbrush to brush your teeth. Be gentle when you floss.  You can have sex (intercourse) unless your doctor tells you not to.  Go to your doctor visits. GET HELP IF:   You feel dizzy.  You have mild cramps or pressure in your lower belly (abdomen).  You have a nagging pain in your belly area.  You  continue to feel sick to your stomach (nauseous), throw up (vomit), or have watery poop (diarrhea).  You have bad smelling fluid coming from your vagina.  You have pain with peeing (urination). GET HELP RIGHT AWAY IF:   You have a fever.  You are leaking fluid from your vagina.  You have spotting or bleeding from your vagina.  You have severe belly cramping or pain.  You lose or gain weight rapidly.  You have trouble catching your breath and have chest pain.  You notice sudden or extreme puffiness (swelling) of your face, hands, ankles, feet, or legs.  You have not felt the baby move  in over an hour.  You have severe headaches that do not go away with medicine.  You have vision changes. Document Released: 09/24/2009 Document Revised: 10/25/2012 Document Reviewed: 08/31/2012 [facility] Patient Information 2015 [place], [place]. This information is not intended to replace advice given to you by your health care provider. Make sure you discuss any questions you have with your health care provider.

## 2014-07-30 NOTE — ED Provider Notes (Signed)
CSN: [id number]     Arrival date & time 07/29/14  2257 History   First MD Initiated Contact with Patient 07/29/14 2345     Chief Complaint  Patient presents with  . Abdominal Pain      HPI Pt was seen at 2355. Per pt, c/o gradual onset and persistence of constant pelvic "cramping" for the past 2 to 3 months. Has been associated with intermittent vaginal "spotting." Pt "thinks" her LMP was approximately 2 months ago. Pt has not taken a home pregnancy test and does not know if she is pregnant. Hx G1P1. Denies back pain, no N/V/D, no fevers, no dysuria/hematuria.    Past Medical History  Diagnosis Date  . Abnormal Pap smear     LAST PAP 09/2010  . Preterm labor 2011  . Anemia     CHILDHOOD  . Headache(784.0)     FREQUENT  . Asthma 2010  . Seizures 2009    WITH MIGRAINE  . SVD (spontaneous vaginal delivery) 12/16/2012  . Wears glasses   . Gonorrhea 07/2013   Past Surgical History  Procedure Laterality Date  . Appendectomy  07/22/2013  . Laparoscopic appendectomy N/A 07/22/2013    Procedure: APPENDECTOMY LAPAROSCOPIC;  Surgeon: Wilmon ArmsMatthew K. Corliss Skainssuei, MD;  Location: [facility] OR;  Service: General;  Laterality: N/A;  . Laparoscopy N/A 07/22/2013    Procedure: LAPAROSCOPY DIAGNOSTIC;  Surgeon: Wilmon ArmsMatthew K. Corliss Skainssuei, MD;  Location: [facility] OR;  Service: General;  Laterality: N/A;   Family History  Problem Relation Age of Onset  . Hypertension Father   . Asthma Father   . Asthma Brother   . Arthritis Paternal Aunt    History  Substance Use Topics  . Smoking status: Never Smoker   . Smokeless tobacco: Never Used  . Alcohol Use: Yes     Comment: 07/22/2013 "birthday parties, etc; a few times/yr"   OB History    Gravida Para Term Preterm AB TAB SAB Ectopic Multiple Living   3 3 2 1  0 0 0 0 0 3     Review of Systems ROS: Statement: All systems negative except as marked or noted in the HPI; Constitutional: Negative for fever and chills. ; ; Eyes: Negative for eye pain, redness and discharge. ; ; ENMT:  Negative for ear pain, hoarseness, nasal congestion, sinus pressure and sore throat. ; ; Cardiovascular: Negative for chest pain, palpitations, diaphoresis, dyspnea and peripheral edema. ; ; Respiratory: Negative for cough, wheezing and stridor. ; ; Gastrointestinal: Negative for nausea, vomiting, diarrhea, abdominal pain, blood in stool, hematemesis, jaundice and rectal bleeding. . ; ; Genitourinary: Negative for dysuria, flank pain and hematuria. ; ; GYN:  +pelvic cramping, +intermittent spotting. No vaginal bleeding, no vaginal discharge, no vulvar pain. ;; Musculoskeletal: Negative for back pain and neck pain. Negative for swelling and trauma.; ; Skin: Negative for pruritus, rash, abrasions, blisters, bruising and skin lesion.; ; Neuro: Negative for headache, lightheadedness and neck stiffness. Negative for weakness, altered level of consciousness , altered mental status, extremity weakness, paresthesias, involuntary movement, seizure and syncope.     Allergies  Pineapple extract  Home Medications   Prior to Admission medications   Medication Sig Start Date End Date Taking? Authorizing Provider  albuterol (PROVENTIL HFA;VENTOLIN HFA) 108 (90 BASE) MCG/ACT inhaler Inhale 2 puffs into the lungs every 6 (six) hours as needed for wheezing. 10/15/13  Yes Ronnald NianJohn C Lalonde, MD  ibuprofen (ADVIL,MOTRIN) 800 MG tablet Take 800 mg by mouth every 8 (eight) hours as needed for moderate pain.  Yes Historical Provider, MD  ondansetron (ZOFRAN ODT) 4 MG disintegrating tablet  ODT q4 hours prn nausea/vomit Patient not taking: Reported on 07/30/2014 12/06/13   Enid Skeens, MD   BP 106/71 mmHg  Pulse 77  Temp(Src) 98.3 F (36.8 C) (Oral)  Resp 21  SpO2 100%  LMP 04/28/2014 Physical Exam  0005: Physical examination:  Nursing notes reviewed; Vital signs and O2 SAT reviewed;  Constitutional: Well developed, Well nourished, Well hydrated, In no acute distress; Head:  Normocephalic, atraumatic; Eyes: EOMI,  PERRL, No scleral icterus; ENMT: Mouth and pharynx normal, Mucous membranes moist; Neck: Supple, Full range of motion, No lymphadenopathy; Cardiovascular: Regular rate and rhythm, No murmur, rub, or gallop; Respiratory: Breath sounds clear & equal bilaterally, No rales, rhonchi, wheezes.  Speaking full sentences with ease, Normal respiratory effort/excursion; Chest: Nontender, Movement normal; Abdomen: Soft, +gravid, +mild suprapubic tenderness to palp. No rebound or guarding. Nondistended, Normal bowel sounds; Genitourinary: No CVA tenderness. Pelvic exam performed with permission of pt and female ED tech assist during exam.  External genitalia w/o lesions. Vaginal vault with thick white discharge. No blood. Cervix w/o lesions, not friable, os closed, no bleeding. GC/chlam and wet prep obtained and sent to lab.  Bimanual exam w/o CMT, uterine or adnexal tenderness.;; Extremities: Pulses normal, No tenderness, No edema, No calf edema or asymmetry.; Neuro: AA&Ox3, Major CN grossly intact.  Speech clear. No gross focal motor or sensory deficits in extremities. Climbs on and off stretcher easily by herself. Gait steady.; Skin: Color normal, Warm, Dry.    ED Course  Procedures     EKG Interpretation None      MDM  MDM Reviewed: previous chart, nursing note and vitals Interpretation: labs and ultrasound     Results for orders placed or performed during the hospital encounter of 07/29/14  Wet prep, genital  Result Value Ref Range   Yeast Wet Prep HPF POC NONE SEEN NONE SEEN   Trich, Wet Prep NONE SEEN NONE SEEN   Clue Cells Wet Prep HPF POC FEW (A) NONE SEEN   WBC, Wet Prep HPF POC FEW (A) NONE SEEN  CBC with Differential  Result Value Ref Range   WBC 6.6 4.0 - 10.5 K/uL   RBC 3.64 (L) 3.87 - 5.11 MIL/uL   Hemoglobin 11.2 (L) 12.0 - 15.0 g/dL   HCT 16.1 (L) 09.6 - 04.5 %   MCV 92.3 78.0 - 100.0 fL   MCH 30.8 26.0 - 34.0 pg   MCHC 33.3 30.0 - 36.0 g/dL   RDW 40.9 81.1 - 91.4 %    Platelets 216 150 - 400 K/uL   Neutrophils Relative % 56 43 - 77 %   Neutro Abs 3.7 1.7 - 7.7 K/uL   Lymphocytes Relative 32 12 - 46 %   Lymphs Abs 2.1 0.7 - 4.0 K/uL   Monocytes Relative 10 3 - 12 %   Monocytes Absolute 0.7 0.1 - 1.0 K/uL   Eosinophils Relative 2 0 - 5 %   Eosinophils Absolute 0.1 0.0 - 0.7 K/uL   Basophils Relative 0 0 - 1 %   Basophils Absolute 0.0 0.0 - 0.1 K/uL  Comprehensive metabolic panel  Result Value Ref Range   Sodium 134 (L) 135 - 145 mmol/L   Potassium 3.2 (L) 3.5 - 5.1 mmol/L   Chloride 104 96 - 112 mEq/L   CO2 21 19 - 32 mmol/L   Glucose, Bld 92 70 - 99 mg/dL   BUN <5 (L) 6 - 23  mg/dL   Creatinine, Ser 1.61 0.50 - 1.10 mg/dL   Calcium 8.5 8.4 - 09.6 mg/dL   Total Protein 6.6 6.0 - 8.3 g/dL   Albumin 3.1 (L) 3.5 - 5.2 g/dL   AST 21 0 - 37 U/L   ALT 10 0 - 35 U/L   Alkaline Phosphatase 58 39 - 117 U/L   Total Bilirubin 0.3 0.3 - 1.2 mg/dL   GFR calc non Af Amer >90 >90 mL/min   GFR calc Af Amer >90 >90 mL/min   Anion gap 9 5 - 15  Lipase, blood  Result Value Ref Range   Lipase 30 11 - 59 U/L  Pregnancy, urine  Result Value Ref Range   Preg Test, Ur POSITIVE (A) NEGATIVE  Urinalysis, Routine w reflex microscopic  Result Value Ref Range   Color, Urine YELLOW YELLOW   APPearance CLEAR CLEAR   Specific Gravity, Urine 1.011 1.005 - 1.030   pH 6.5 5.0 - 8.0   Glucose, UA NEGATIVE NEGATIVE mg/dL   Hgb urine dipstick NEGATIVE NEGATIVE   Bilirubin Urine NEGATIVE NEGATIVE   Ketones, ur NEGATIVE NEGATIVE mg/dL   Protein, ur NEGATIVE NEGATIVE mg/dL   Urobilinogen, UA 1.0 0.0 - 1.0 mg/dL   Nitrite NEGATIVE NEGATIVE   Leukocytes, UA SMALL (A) NEGATIVE  Urine microscopic-add on  Result Value Ref Range   Squamous Epithelial / LPF RARE RARE   WBC, UA 3-6 <3 WBC/hpf   RBC / HPF 0-2 <3 RBC/hpf   Bacteria, UA RARE RARE  hCG, quantitative, pregnancy  Result Value Ref Range   hCG, Beta Chain, Quant, S 3556 (H) <5 mIU/mL  ABO/Rh  Result Value Ref  Range   ABO/RH(D) O POS    No rh immune globuloin NOT A RH IMMUNE GLOBULIN CANDIDATE, PT RH POSITIVE    US Ob Limited 07/30/2014   CLINICAL DATA:  Pelvic pain, cramping and spotting, gestational age by last menstrual period 10 weeks and 1 day.  EXAM: OBSTETRIC <14 WK [place] AND TRANSVAGINAL OB US  TECHNIQUE: Both transabdominal and transvaginal ultrasound examinations were performed for complete evaluation of the gestation as well as the maternal uterus, adnexal regions, and pelvic cul-de-sac. Transvaginal technique was performed to assess early pregnancy.  COMPARISON:  None.  FINDINGS: Number of Fetuses: 1  Heart Rate:  144 bpm  Movement: Yes  Presentation: Cephalic  Placental Location: Posterior  Previa: No  Amniotic Fluid (Subjective): Within normal limits.  BPD:  6.6cm 24w  5d ; EDD Nov 14, 2014  MATERNAL FINDINGS:  Cervix:  Appears closed.  Uterus:  No acute abnormality visualized.  Adnexal not evaluated.  IMPRESSION: Single live intrauterine pregnancy, gestational age by ultrasound 24 weeks and 5 days, EDD Nov 14, 2014 without immediate complications.  This exam is performed on an emergent basis and does not comprehensively evaluate fetal size, dating, or anatomy; follow-up complete OB US should be considered if further fetal assessment is warranted.   Electronically Signed   By: Awilda Metro   On: 07/30/2014 02:12    0310:  Pt states she does not have an OB/GYN doctor.  T[facility]C to [facility], case discussed, including:  HPI, pertinent PM/SHx, VS/PE, dx testing, ED course and treatment:  Requests to have pt call her OB/GYN from the previous pregnancy or call the [facility] on Monday to try to be seen in f/u in a timely manner.   0410:  Will tx for BV while GC/chlam pending. Dx and testing, as well as d/w  OB/GYN MD, d/w pt.  Questions answered.  Verb understanding, agreeable to d/c home with outpt f/u.   Samuel Jester, DO 08/01/14 1013

## 2014-07-30 NOTE — MAU Note (Signed)
Patient presents at [redacted] weeks gestation with complaint of intermittent, lower right abdominal pain X 1 month. [facility] and found out she was pregnant. Denies bleeding or discharge.

## 2014-07-30 NOTE — MAU Provider Note (Signed)
Chief Complaint:  Abdominal Pain   First Provider Initiated Contact with Patient 07/30/14 2032    HPI: Laurie [name] is a 24 y.o. [id number] at [redacted]w[redacted]d who presents to maternity admissions reporting abdominal pain. Patient was seen yesterday for similar complaints. Pain reported is at her lower quadrants. Last BM was yesterday and she states its normal for her to go a day w/o one. Patient was prescribed PNV's, flagyl, and zofran yesterday but has not filled scripts yet. She says she plans on doing so tomorrow. On further inquiry patient appeared to be mostly concerned for baby's wellbeing 2/2 her actions prior to dx of pregnancy. She reports she drinks a LOT of caffeine. Mostly in the form of Pepsi. She also reports alcohol consumption but was unable to inform me of a proper estimate of how much/often. Did not self-report any drug use but provider did not inquire of elicit drug use--I only informed her we were going to perform a UDS and patient was ok w/ this. Spent most of our time discussing these issues as well as encouragement to stop any EtOH, caffeine, and other drugs now that she is aware of pregnancy.  Denies contractions, leakage of fluid or vaginal bleeding. Good fetal movement.   Pregnancy Course:   Past Medical History: Past Medical History  Diagnosis Date  . Abnormal Pap smear     LAST PAP 09/2010  . Preterm labor 2011  . Anemia     CHILDHOOD  . Headache(784.0)     FREQUENT  . Asthma 2010  . Seizures 2009    WITH MIGRAINE  . SVD (spontaneous vaginal delivery) 12/16/2012  . Wears glasses   . Gonorrhea 07/2013    Past obstetric history: OB History  Gravida Para Term Preterm AB SAB TAB Ectopic Multiple Living  0 0 0 0 0 3    # Outcome Date GA Lbr Len/2nd Weight Sex Delivery Anes PTL Lv  4 Current           3 Term 12/16/12 [redacted]w[redacted]d 05:50 / 00:12 3.16 kg (6 lb 15.5 oz) M Vag-Spont EPI  Y  2 Preterm 06/25/10 [redacted]w[redacted]d 12:00 2.863 kg (6 lb 5 oz) F Vag-Spont EPI Y Y      Comments: IUGR; IOL  1 Term 08/18/07 [redacted]w[redacted]d 05:00 2.977 kg (6 lb 9 oz) M Vag-Spont EPI  Y     Comments: No complications      Past Surgical History: Past Surgical History  Procedure Laterality Date  . Appendectomy  07/22/2013  . Laparoscopic appendectomy N/A 07/22/2013    Procedure: APPENDECTOMY LAPAROSCOPIC;  Surgeon: Wilmon Arms. Corliss Skains, MD;  Location: [facility] OR;  Service: General;  Laterality: N/A;  . Laparoscopy N/A 07/22/2013    Procedure: LAPAROSCOPY DIAGNOSTIC;  Surgeon: Wilmon Arms. Corliss Skains, MD;  Location: [facility] OR;  Service: General;  Laterality: N/A;     Family History: Family History  Problem Relation Age of Onset  . Hypertension Father   . Asthma Father   . Asthma Brother   . Arthritis Paternal Aunt     Social History: History  Substance Use Topics  . Smoking status: Never Smoker   . Smokeless tobacco: Never Used  . Alcohol Use: Yes     Comment: 07/22/2013 "birthday parties, etc; a few times/yr"    Allergies:  Allergies  Allergen Reactions  . Pineapple Extract Itching and Rash    Meds:  Prescriptions prior to admission  Medication Sig Dispense Refill Last Dose  . albuterol (PROVENTIL HFA;VENTOLIN  HFA) 108 (90 BASE) MCG/ACT inhaler Inhale 2 puffs into the lungs every 6 (six) hours as needed for wheezing. 1 Inhaler 0 Past Week at Unknown time  . metroNIDAZOLE (FLAGYL) 500 MG tablet Take 1 tablet (500 mg total) by mouth 2 (two) times daily. 14 tablet 0 Pt has not picked up RX yet  . ondansetron (ZOFRAN ODT) 4 MG disintegrating tablet  ODT q4 hours prn nausea/vomit (Patient not taking: Reported on 07/30/2014) 4 tablet 0   . Prenatal Vit-Fe Fumarate-FA (PRENATAL COMPLETE) 14-0.4 MG TABS Take 1 tablet by mouth daily. 30 each 0 Pt has not picked upRX yet    ROS: Pertinent findings in history of present illness.  Physical Exam  Blood pressure 96/65, pulse 74, temperature 97.5 F (36.4 C), temperature source Oral, resp. rate 16, height  (1.676 m), weight 61.236 kg (135 lb),  last menstrual period 04/28/2014. GENERAL: Well-developed, well-nourished female in no acute distress.  HEENT: normocephalic HEART: normal rate RESP: normal effort ABDOMEN: Soft, non-tender, gravid appropriate for gestational age EXTREMITIES: Nontender, no edema NEURO: alert and oriented SPECULUM EXAM: NEFG, physiologic discharge, no blood, cervix clean    FHT:  Baseline 135, moderate variability Contractions: none   Labs: Results for orders placed or performed during the hospital encounter of 07/29/14 (from the past 24 hour(s))  CBC with Differential     Status: Abnormal   Collection Time: 07/29/14 11:21 PM  Result Value Ref Range   WBC 6.6 4.0 - 10.5 K/uL   RBC 3.64 (L) 3.87 - 5.11 MIL/uL   Hemoglobin 11.2 (L) 12.0 - 15.0 g/dL   HCT 16.1 (L) 09.6 - 04.5 %   MCV 92.3 78.0 - 100.0 fL   MCH 30.8 26.0 - 34.0 pg   MCHC 33.3 30.0 - 36.0 g/dL   RDW 40.9 81.1 - 91.4 %   Platelets 216 150 - 400 K/uL   Neutrophils Relative % 56 43 - 77 %   Neutro Abs 3.7 1.7 - 7.7 K/uL   Lymphocytes Relative 32 12 - 46 %   Lymphs Abs 2.1 0.7 - 4.0 K/uL   Monocytes Relative 10 3 - 12 %   Monocytes Absolute 0.7 0.1 - 1.0 K/uL   Eosinophils Relative 2 0 - 5 %   Eosinophils Absolute 0.1 0.0 - 0.7 K/uL   Basophils Relative 0 0 - 1 %   Basophils Absolute 0.0 0.0 - 0.1 K/uL  Comprehensive metabolic panel     Status: Abnormal   Collection Time: 07/29/14 11:21 PM  Result Value Ref Range   Sodium 134 (L) 135 - 145 mmol/L   Potassium 3.2 (L) 3.5 - 5.1 mmol/L   Chloride 104 96 - 112 mEq/L   CO2 21 19 - 32 mmol/L   Glucose, Bld 92 70 - 99 mg/dL   BUN <5 (L) 6 - 23 mg/dL   Creatinine, Ser 7.82 0.50 - 1.10 mg/dL   Calcium 8.5 8.4 - 95.6 mg/dL   Total Protein 6.6 6.0 - 8.3 g/dL   Albumin 3.1 (L) 3.5 - 5.2 g/dL   AST 21 0 - 37 U/L   ALT 10 0 - 35 U/L   Alkaline Phosphatase 58 39 - 117 U/L   Total Bilirubin 0.3 0.3 - 1.2 mg/dL   GFR calc non Af Amer >90 >90 mL/min   GFR calc Af Amer >90 >90 mL/min    Anion gap 9 5 - 15  Lipase, blood     Status: None   Collection Time: 07/29/14 11:21  PM  Result Value Ref Range   Lipase 30 11 - 59 U/L  Pregnancy, urine     Status: Abnormal   Collection Time: 07/29/14 11:28 PM  Result Value Ref Range   Preg Test, Ur POSITIVE (A) NEGATIVE  Urinalysis, Routine w reflex microscopic     Status: Abnormal   Collection Time: 07/29/14 11:28 PM  Result Value Ref Range   Color, Urine YELLOW YELLOW   APPearance CLEAR CLEAR   Specific Gravity, Urine 1.011 1.005 - 1.030   pH 6.5 5.0 - 8.0   Glucose, UA NEGATIVE NEGATIVE mg/dL   Hgb urine dipstick NEGATIVE NEGATIVE   Bilirubin Urine NEGATIVE NEGATIVE   Ketones, ur NEGATIVE NEGATIVE mg/dL   Protein, ur NEGATIVE NEGATIVE mg/dL   Urobilinogen, UA 1.0 0.0 - 1.0 mg/dL   Nitrite NEGATIVE NEGATIVE   Leukocytes, UA SMALL (A) NEGATIVE  Urine microscopic-add on     Status: None   Collection Time: 07/29/14 11:28 PM  Result Value Ref Range   Squamous Epithelial / LPF RARE RARE   WBC, UA 3-6 <3 WBC/hpf   RBC / HPF 0-2 <3 RBC/hpf   Bacteria, UA RARE RARE  hCG, quantitative, pregnancy     Status: Abnormal   Collection Time: 07/30/14 12:01 AM  Result Value Ref Range   hCG, Beta Chain, Quant, S 3556 (H) <5 mIU/mL  ABO/Rh     Status: None   Collection Time: 07/30/14 12:01 AM  Result Value Ref Range   ABO/RH(D) O POS    No rh immune globuloin NOT A RH IMMUNE GLOBULIN CANDIDATE, PT RH POSITIVE   Wet prep, genital     Status: Abnormal   Collection Time: 07/30/14  3:06 AM  Result Value Ref Range   Yeast Wet Prep HPF POC NONE SEEN NONE SEEN   Trich, Wet Prep NONE SEEN NONE SEEN   Clue Cells Wet Prep HPF POC FEW (A) NONE SEEN   WBC, Wet Prep HPF POC FEW (A) NONE SEEN    Imaging:  [place] Ob Limited  07/30/2014   CLINICAL DATA:  Pelvic pain, cramping and spotting, gestational age by last menstrual period 10 weeks and 1 day.  EXAM: OBSTETRIC <14 WK US AND TRANSVAGINAL OB US  TECHNIQUE: Both transabdominal and transvaginal  ultrasound examinations were performed for complete evaluation of the gestation as well as the maternal uterus, adnexal regions, and pelvic cul-de-sac. Transvaginal technique was performed to assess early pregnancy.  COMPARISON:  None.  FINDINGS: Number of Fetuses: 1  Heart Rate:  144 bpm  Movement: Yes  Presentation: Cephalic  Placental Location: Posterior  Previa: No  Amniotic Fluid (Subjective): Within normal limits.  BPD:  6.6cm 24w  5d ; EDD Nov 14, 2014  MATERNAL FINDINGS:  Cervix:  Appears closed.  Uterus:  No acute abnormality visualized.  Adnexal not evaluated.  IMPRESSION: Single live intrauterine pregnancy, gestational age by ultrasound 24 weeks and 5 days, EDD Nov 14, 2014 without immediate complications.  This exam is performed on an emergent basis and does not comprehensively evaluate fetal size, dating, or anatomy; follow-up complete OB US should be considered if further fetal assessment is warranted.   Electronically Signed   By: Awilda Metroourtnay  Bloomer   On: 07/30/2014 02:12   [place] Ob Transvaginal  07/30/2014   CLINICAL DATA:  Pelvic pain, cramping and spotting, gestational age by last menstrual period 10 weeks and 1 day.  EXAM: OBSTETRIC <14 WK US AND TRANSVAGINAL OB US  TECHNIQUE: Both transabdominal and transvaginal ultrasound examinations  were performed for complete evaluation of the gestation as well as the maternal uterus, adnexal regions, and pelvic cul-de-sac. Transvaginal technique was performed to assess early pregnancy.  COMPARISON:  None.  FINDINGS: Number of Fetuses: 1  Heart Rate:  144 bpm  Movement: Yes  Presentation: Cephalic  Placental Location: Posterior  Previa: No  Amniotic Fluid (Subjective): Within normal limits.  BPD:  6.6cm 24w  5d ; EDD Nov 14, 2014  MATERNAL FINDINGS:  Cervix:  Appears closed.  Uterus:  No acute abnormality visualized.  Adnexal not evaluated.  IMPRESSION: Single live intrauterine pregnancy, gestational age by ultrasound 24 weeks and 5 days, EDD Nov 14, 2014 without  immediate complications.  This exam is performed on an emergent basis and does not comprehensively evaluate fetal size, dating, or anatomy; follow-up complete OB US should be considered if further fetal assessment is warranted.   Electronically Signed   By: Awilda Metro   On: 07/30/2014 02:12   Assessment: No diagnosis found. - noncompliance   Plan: Discharge home Labor precautions and fetal kick counts UDS pending Informed patient of need to begin PNV Asked patient to call OBGYN clinic tomorrow to set up appointment.    Medication List    ASK your doctor about these medications        albuterol 108 (90 BASE) MCG/ACT inhaler  Commonly known as:  PROVENTIL HFA;VENTOLIN HFA  Inhale 2 puffs into the lungs every 6 (six) hours as needed for wheezing.     metroNIDAZOLE 500 MG tablet  Commonly known as:  FLAGYL  Take 1 tablet (500 mg total) by mouth 2 (two) times daily.     ondansetron 4 MG disintegrating tablet  Commonly known as:  ZOFRAN ODT  4mg  ODT q4 hours prn nausea/vomit     PRENATAL COMPLETE 14-0.4 MG Tabs  Take 1 tablet by mouth daily.        Kathee Delton, MD 07/30/2014 8:56 PM

## 2014-07-30 NOTE — ED Notes (Signed)
Pelvic cart set up at bedside  

## 2014-07-30 NOTE — Discharge Instructions (Signed)
°Emergency Department Resource Guide °1) Find a Doctor and Pay Out of Pocket °Although you won't have to find out who is covered by your insurance plan, it is a good idea to ask around and get recommendations. You will then need to call the office and see if the doctor you have chosen will accept you as a new patient and what types of options they offer for patients who are self-pay. Some doctors offer discounts or will set up payment plans for their patients who do not have insurance, but you will need to ask so you aren't surprised when you get to your appointment. ° °2) Contact Your Local Health Department °Not all health departments have doctors that can see patients for sick visits, but many do, so it is worth a call to see if yours does. If you don't know where your local health department is, you can check in your phone book. The CDC also has a tool to help you locate your state's health department, and many state websites also have listings of all of their local health departments. ° °3) Find a Walk-in Clinic °If your illness is not likely to be very severe or complicated, you may want to try a walk in clinic. These are popping up all over the country in pharmacies, drugstores, and shopping centers. They're usually staffed by nurse practitioners or physician assistants that have been trained to treat common illnesses and complaints. They're usually fairly quick and inexpensive. However, if you have serious medical issues or chronic medical problems, these are probably not your best option. ° °No Primary Care Doctor: °- Call [facility] Connect at  832-8000 - they can help you locate a primary care doctor that  accepts your insurance, provides certain services, etc. °- Physician Referral Service- 1-800-533-3463 ° °Chronic Pain Problems: °Organization         Address  Phone   Notes  °[facility]  (336) 297-2271 Patients need to be referred by their primary care doctor.  ° °Medication  Assistance: °Organization         Address  Phone   Notes  °[place] [facility] [address] °[place], [place] [address] (336) 641-8030 --Must be a resident of [place] °-- Must have NO insurance coverage whatsoever (no Medicaid/ Medicare, etc.) °-- The pt. MUST have a primary care doctor that directs their care regularly and follows them in the community °  °[facility]  (866) 331-1348   °[employer]  (888) 892-1162   ° °Agencies that provide inexpensive medical care: °Organization         Address  Phone   Notes  °[facility]  (336) 832-8035   °[facility]    (336) 832-7272   °[facility] [address] °[place], [place] [address] (336) 832-4777   °[facility] [address], °[place] (336) 271-4999   °[facility]    (336) 373-0678   °[facility]    (336) 272-1050   °[facility] ° [address], [place] Phone:  (336) 832-4444, Fax:  (336) 832-4440 Hours of Operation:  9 am - 6 pm, M-F.  Also accepts Medicaid/Medicare and self-pay.  °[facility] ° [address], [place] Phone: (336) 832-3150, Fax: (336) [phone]. Hours of Operation:  8:30 am - 5:30 pm, M-F.  Also accepts Medicaid and self-pay.  °[facility] [place] [address], [place] Phone: (336) 878-6027   °[facility] [address], [place], [place] (336)723-1848, Ext. 123 Mondays & Thursdays: 7-9 AM.  First 15 patients are seen on a first come, first serve basis. °  ° °Medicaid-accepting [place] Providers: ° °Organization         Address  Phone   Notes  °[facility] [address], [place] (336) 641-2100 Also accepts self-pay patients.  °[facility] [address], [place] ° (336) 856-9996   °[facility] [address], [place]  (336) 288-8857   °[facility] [address] [place] [address], [place] (336) 299-7000   °Veita Bland [address], [place]  ° (336) 373-1557 Only accepts [place] Access Medicaid patients after they have their name applied to their card.  ° °Self-Pay (no insurance) in [place]: ° °Organization         Address  Phone   Notes  °Sickle Cell Patients, [facility] [address], [place] (336) 832-1970   °[facility] [address], [place] (336) 832-4400   °[facility] ° [address], [place] (336) 992-4800   °[facility]/Dr. Osei-Bonsu ° [address] [place] [address], [place] or [address], [place] (336) 841-8500 Phone number for both [place] and [place] locations is the same.  °[facility] [address], [place] (336) 299-0000   °[facility] [address] [place] [address], [place] or [address] (336) 852-7530 °(336) 878-2260   °[facility] [address], [place] (336) 350-1642, phone; (336) 294-5005, fax Sees patients 1st and 3rd Saturday of every month.  Must not qualify for public or private insurance (i.e. Medicaid, Medicare, [place] Health Choice, Veterans' Benefits) • Household income should be no more than 200% of the poverty level •The clinic cannot treat you if you are pregnant or think you are pregnant • Sexually transmitted diseases are not treated at the clinic.  ° ° °Dental Care: °Organization         Address  Phone  Notes  °[place] [facility] [address], [place] (336) 641-6152 Accepts children up to age 21 who are enrolled in Medicaid or [place] Health Choice; pregnant women with a Medicaid card; and children who have applied for Medicaid or [place] Health Choice, but were declined, whose parents can pay a reduced fee at time of service.  °[place]  [facility] [place]  [address], [place] (336) 641-7733 Accepts children up to age 21 who are enrolled in Medicaid or [place] Health Choice; pregnant women with a Medicaid card; and children who have applied for Medicaid or [place] Health Choice, but were declined, whose parents can pay a reduced fee at time of service.  °[facility] ° [address], [place] (336) 641-4533 Patients are seen by appointment only. Walk-ins are not accepted. [facility] will see patients 18 years of age and older. °Monday - Tuesday (8am-5pm) °Most Wednesdays (8:30-5pm) °$30 per visit, cash only  °[facility] ° [address], [place] (336) 641-4533 Patients are seen by appointment only. Walk-ins are not accepted. [facility] will see patients 18 years of age and older. °One   Wednesday Evening (Monthly: Volunteer Based).  $30 per visit, cash only  °[facility]  (919) 537-3737 for adults; Children under age 4, call Graduate Pediatric Dentistry at ([phone]) [phone]. Children aged 4-14, please call ([phone]) [phone] to request a pediatric application. ° Dental services are provided in all areas of dental care including fillings, crowns and bridges, complete and partial dentures, implants, gum treatment, root canals, and extractions. Preventive care is also provided. Treatment is provided to both adults and children. °Patients are selected via a lottery and there is often a waiting list. °  °[facility] [address], °[place] ° (336) 763-8833 [email or web address] °  °[facility] [address], [place], [place] (336)723-1848, Ext. 123 Second and Fourth Thursday of each month, opens at 6:30 AM; Clinic ends at 9 AM.  Patients are seen on a first-come first-served basis, and a limited number are seen during each clinic.  ° °[facility] ° [address], [place], [place] (336) 723-7904    Eligibility Requirements °You must have lived in [place], [place], or [place] counties for at least the last three months. °  You cannot be eligible for state or federal sponsored healthcare insurance, including Veterans Administration, Medicaid, or Medicare. °  You generally cannot be eligible for healthcare insurance through your employer.  °  How to apply: °Eligibility screenings are held every Tuesday and Wednesday afternoon from 1:00 pm until 4:00 pm. You do not need an appointment for the interview!  °[facility] [address], [place], [place] 336-631-2330   °[facility]  336-342-8273   °[place]  336-703-3100   °[facility]  336-570-6415   ° °Behavioral Health Resources in the Community: °Intensive Outpatient Programs °Organization         Address  Phone  Notes  °[facility] [address], [place], [place] 336-878-6098   °[facility] [address], [place], [place] 336-832-9800   °[facility] [address], [place], [place] ° 336-882-2125   °[facility] [address],  °[place], [place] 1-800-853-5163 or 336-641-4981   °Substance Abuse Resources °Organization         Address  Phone  Notes  °[facility]  336-882-2125   °[facility]  336-784-9470   °[facility]  336-285-9073   °[facility]  336-845-3988   °Residential & Outpatient Substance Abuse Program  1-800-659-3381   °Psychological Services °Organization         Address  Phone  Notes  °[facility]  336- 832-9600   °[facility]  336- 378-7881   °[facility] [address], [place] 1-800-853-5163 or 336-641-4981   ° °Mobile Crisis Teams °Organization         Address  Phone  Notes  °[facility], Mobile Crisis Care Unit  1-877-626-1772   °Assertive °[facility] ° [address]  [place], [place] 336-834-9664   °Sharon DeEsch [address] °[place] 336-554-5454   ° °Self-Help/Support Groups °Organization         Address  Phone             Notes  °[facility] of  - variety of support groups  336- 373-1402 Call for more information  °Narcotics Anonymous (NA), [facility] [address], °[place]  2 meetings at this location  ° °  Residential Treatment Programs Organization         Address  Phone  Notes  [facility] [address],    [place]  [phone]   [facility]  [address], [place] [phone], [place], [place] [phone]   [facility] [address], [place] [phone]) [phone] Admissions: 8am-3pm M-F  [facility] [address]    [place], [place] [phone]   [facility] [address], [place], [place] [phone]   [facility] [address]  [place], [place] [phone]   [facility] Intensive Outpatient [address], [place], [place] [phone]   [facility] ([facility] [address]  [place], [place] [phone] or [phone] [phone]   [facility] ([facility]) [address] [place], [place] [phone] Accepts Medicaid  [facility] [address]  [place] [phone] Substance Abuse/Addiction Treatment   [facility]         Address  Phone  Notes  [facility]  (747)568-0295(888) 340-600-6942   Angie FavaJulie Brannon, PhD [address] [place], [place]   [phone](336) [phone] or [phone](336) [phone]   [facility]   [address] [place], [place] [phone](336) [phone]   [facility] [address], [place], [place] [phone](336) [phone] Insurance/Medicaid/sponsorship through [facility] [address]                                    [place], [place] [phone](336) [phone] Therapy/tele-psych/case    [facility] [address]   [place], [place] [phone](336) [phone]    Dr. Lolly MustacheArfeen  [phone](336) [phone]   [facility] [place] [facility]  [employer] [facility] 1) [address], [place] 2) [address], [place] 3)  [address], [place] [phone](336) [phone](336) [phone]  [phone](336) [phone]   [facility] Child Abuse Hotline [phone](336) [phone] or [phone](336) [phone] (After Hours)      Your ultrasound today showed a single intrauterine pregnancy appoximately 24 weeks and 875 days old.  Avoid strenuous activity and do NOT place anything into your vagina, ie: no douching, no tampons, no sexual intercourse, no swimming or tub baths, until you are seen in follow up by your regular OB/GYN doctor.  Call your regular OB/GYN doctor on Monday morning to schedule a follow up appointment within the next 3 days.  Return to the Emergency Department immediately if worsening.

## 2014-07-31 LAB — RAPID URINE DRUG SCREEN, HOSP PERFORMED
Amphetamines: NOT DETECTED
BARBITURATES: NOT DETECTED
Benzodiazepines: NOT DETECTED
Cocaine: NOT DETECTED
Opiates: NOT DETECTED
Tetrahydrocannabinol: NOT DETECTED

## 2014-07-31 LAB — GC/CHLAMYDIA PROBE AMP (~~LOC~~) NOT AT ARMC
CHLAMYDIA, DNA PROBE: POSITIVE — AB
NEISSERIA GONORRHEA: POSITIVE — AB

## 2014-08-02 ENCOUNTER — Encounter (HOSPITAL_COMMUNITY): Payer: Self-pay | Admitting: *Deleted

## 2014-08-02 ENCOUNTER — Inpatient Hospital Stay (HOSPITAL_COMMUNITY)
Admission: AD | Admit: 2014-08-02 | Discharge: 2014-08-02 | Disposition: A | Discharge status: Home / Self Care | Payer: Managed Care, Other (non HMO) | Source: Ambulatory Visit | Attending: Obstetrics and Gynecology | Admitting: Obstetrics and Gynecology

## 2014-08-02 DIAGNOSIS — A5609 Other chlamydial infection of lower genitourinary tract: Secondary | ICD-10-CM | POA: Diagnosis not present

## 2014-08-02 DIAGNOSIS — O98812 Other maternal infectious and parasitic diseases complicating pregnancy, second trimester: Secondary | ICD-10-CM | POA: Diagnosis not present

## 2014-08-02 DIAGNOSIS — Z3A25 25 weeks gestation of pregnancy: Secondary | ICD-10-CM | POA: Diagnosis not present

## 2014-08-02 DIAGNOSIS — O4692 Antepartum hemorrhage, unspecified, second trimester: Secondary | ICD-10-CM | POA: Diagnosis not present

## 2014-08-02 LAB — URINALYSIS, ROUTINE W REFLEX MICROSCOPIC
Bilirubin Urine: NEGATIVE
GLUCOSE, UA: NEGATIVE mg/dL
Ketones, ur: NEGATIVE mg/dL
NITRITE: NEGATIVE
Protein, ur: NEGATIVE mg/dL
Specific Gravity, Urine: 1.02 (ref 1.005–1.030)
UROBILINOGEN UA: 1 mg/dL (ref 0.0–1.0)
pH: 7 (ref 5.0–8.0)

## 2014-08-02 LAB — URINE MICROSCOPIC-ADD ON

## 2014-08-02 MED ORDER — CEFTRIAXONE SODIUM 250 MG IJ SOLR
250.0000 mg | Freq: Once | INTRAMUSCULAR | Status: AC
  Administered 2014-08-02: 250 mg via INTRAMUSCULAR
  Filled 2014-08-02: qty 250

## 2014-08-02 MED ORDER — AZITHROMYCIN 250 MG PO TABS
1000.0000 mg | ORAL_TABLET | Freq: Once | ORAL | Status: AC
  Administered 2014-08-02: 1000 mg via ORAL
  Filled 2014-08-02: qty 4

## 2014-08-02 NOTE — MAU Provider Note (Signed)
Chief Complaint:  No chief complaint on file.   First Provider Initiated Contact with Patient 08/02/14 [phone]    HPI: Laurie FreibergShantana [name] is a 24 y.o. [id number] at 4236w1d who presents to maternity admissions reporting vaginal bleeding and abdom pain. Pt states this pain has been ongoing for ~1 week. Patient noticed bleeding this morning upon waking up. Patient was unable to describe the amount of blood present. Was not wearing a pad at presentation to MAU. Small dime-sized stain noticed by nursing staff--this was brown/dark red in color. No frank blood reported. Denies fluid leakage, fever, chills, dysuria.  Patient has had no PNC at this time. Patient was seen 3 days ago for similar complaints. [facility] to that visit [facility] she was diagnosed with her pregnancy and received prescriptions for flagyl (for BV), zofran (nausea), and a PNV. Patient has not filled any of these prescriptions. She was also [facility]. Patient has not yet set up an appointment for her first prenatal visit.   At pt's visit 3 days ago a limited US was obtained and r/o placenta previa. A GC/Chlamydia was obtained and was positive. Patient informed of this today. Visibly upset.   Rh pos  Past Medical History: Past Medical History  Diagnosis Date  . Abnormal Pap smear     LAST PAP 09/2010  . Preterm labor 2011  . Anemia     CHILDHOOD  . Headache(784.0)     FREQUENT  . Asthma 2010  . Seizures 2009    WITH MIGRAINE  . SVD (spontaneous vaginal delivery) 12/16/2012  . Wears glasses   . Gonorrhea 07/2013    Past obstetric history: OB History  Gravida Para Term Preterm AB SAB TAB Ectopic Multiple Living  4 3 2 1  0 0 0 0 0 3    # Outcome Date GA Lbr Len/2nd Weight Sex Delivery Anes PTL Lv  4 Current           3 Term 12/16/12 7836w5d 05:50 / 00:12 3.16 kg (6 lb 15.5 oz) M Vag-Spont EPI  Y  2 Preterm 06/25/10 1641w0d 12:00 2.863 kg (6 lb 5 oz) F Vag-Spont  EPI Y Y     Comments: IUGR; IOL  1 Term 08/18/07 5052w4d 05:00 2.977 kg (6 lb 9 oz) M Vag-Spont EPI  Y     Comments: No complications      Past Surgical History: Past Surgical History  Procedure Laterality Date  . Appendectomy  07/22/2013  . Laparoscopic appendectomy N/A 07/22/2013    Procedure: APPENDECTOMY LAPAROSCOPIC;  Surgeon: Wilmon ArmsMatthew K. Corliss Skainssuei, MD;  Location: [facility] OR;  Service: General;  Laterality: N/A;  . Laparoscopy N/A 07/22/2013    Procedure: LAPAROSCOPY DIAGNOSTIC;  Surgeon: Wilmon ArmsMatthew K. Corliss Skainssuei, MD;  Location: [facility] OR;  Service: General;  Laterality: N/A;     Family History: Family History  Problem Relation Age of Onset  . Hypertension Father   . Asthma Father   . Asthma Brother   . Arthritis Paternal Aunt     Social History: History  Substance Use Topics  . Smoking status: Never Smoker   . Smokeless tobacco: Never Used  . Alcohol Use: Yes     Comment: 07/22/2013 "birthday parties, etc; a few times/yr"LAST   TIME1-2015    Allergies:  Allergies  Allergen Reactions  . Pineapple Extract Itching and Rash    Meds:  Prescriptions prior to admission  Medication Sig Dispense Refill  Last Dose  . albuterol (PROVENTIL HFA;VENTOLIN HFA) 108 (90 BASE) MCG/ACT inhaler Inhale 2 puffs into the lungs every 6 (six) hours as needed for wheezing. 1 Inhaler 0 Past Week at Unknown time  . metroNIDAZOLE (FLAGYL) 500 MG tablet Take 1 tablet (500 mg total) by mouth 2 (two) times daily. 14 tablet 0 NOT  FILLED  . ondansetron (ZOFRAN ODT) 4 MG disintegrating tablet  ODT q4 hours prn nausea/vomit (Patient not taking: Reported on 07/30/2014) 4 tablet 0 More than a month at Unknown time  . Prenatal Vit-Fe Fumarate-FA (PRENATAL COMPLETE) 14-0.4 MG TABS Take 1 tablet by mouth daily. 30 each 0 NOTTAKING    ROS: Pertinent findings in history of present illness.  Physical Exam  Blood pressure 96/60, pulse 75, temperature 97.5 F (36.4 C), temperature source Oral, resp. rate 20, height  (1.651  m), weight 61.349 kg (135 lb 4 oz), last menstrual period 04/28/2014. GENERAL: Well-developed, well-nourished female in no acute distress.  HEENT: normocephalic HEART: normal rate RESP: normal effort ABDOMEN: Soft, non-tender, gravid appropriate for gestational age EXTREMITIES: Nontender, no edema NEURO: alert and oriented SPECULUM EXAM: NEFG, purulent discharge, scant blood, cervix erythematous, tender, and friable appearance.   Dilation: Closed Effacement (%): Thick Cervical Position: Middle Exam by:: Garlene Apperson, CNM   FHT:  Baseline 140, moderate variability, no accelerations present, no decelerations Contractions: none   Labs: Results for orders placed or performed during the hospital encounter of 08/02/14 (from the past 24 hour(s))  Urinalysis, Routine w reflex microscopic     Status: Abnormal   Collection Time: 08/02/14  5:11 AM  Result Value Ref Range   Color, Urine YELLOW YELLOW   APPearance HAZY (A) CLEAR   Specific Gravity, Urine 1.020 1.005 - 1.030   pH 7.0 5.0 - 8.0   Glucose, UA NEGATIVE NEGATIVE mg/dL   Hgb urine dipstick LARGE (A) NEGATIVE   Bilirubin Urine NEGATIVE NEGATIVE   Ketones, ur NEGATIVE NEGATIVE mg/dL   Protein, ur NEGATIVE NEGATIVE mg/dL   Urobilinogen, UA 1.0 0.0 - 1.0 mg/dL   Nitrite NEGATIVE NEGATIVE   Leukocytes, UA MODERATE (A) NEGATIVE  Urine microscopic-add on     Status: Abnormal   Collection Time: 08/02/14  5:11 AM  Result Value Ref Range   Squamous Epithelial / LPF RARE RARE   WBC, UA 3-6 <3 WBC/hpf   RBC / HPF 7-10 <3 RBC/hpf   Bacteria, UA RARE RARE   Crystals CA OXALATE CRYSTALS (A) NEGATIVE    Imaging:  US Ob Limited  07/30/2014   CLINICAL DATA:  Pelvic pain, cramping and spotting, gestational age by last menstrual period 10 weeks and 1 day.  EXAM: OBSTETRIC <14 WK [place] AND TRANSVAGINAL OB US  TECHNIQUE: Both transabdominal and transvaginal ultrasound examinations were performed for complete evaluation of the gestation as well as the  maternal uterus, adnexal regions, and pelvic cul-de-sac. Transvaginal technique was performed to assess early pregnancy.  COMPARISON:  None.  FINDINGS: Number of Fetuses: 1  Heart Rate:  144 bpm  Movement: Yes  Presentation: Cephalic  Placental Location: Posterior  Previa: No  Amniotic Fluid (Subjective): Within normal limits.  BPD:  6.6cm 24w  5d ; EDD Nov 14, 2014  MATERNAL FINDINGS:  Cervix:  Appears closed.  Uterus:  No acute abnormality visualized.  Adnexal not evaluated.  IMPRESSION: Single live intrauterine pregnancy, gestational age by ultrasound 24 weeks and 5 days, EDD Nov 14, 2014 without immediate complications.  This exam is performed on an emergent basis and does  not comprehensively evaluate fetal size, dating, or anatomy; follow-up complete OB US should be considered if further fetal assessment is warranted.   Electronically Signed   By: Awilda Metro   On: 07/30/2014 02:12   US Ob Transvaginal  07/30/2014   CLINICAL DATA:  Pelvic pain, cramping and spotting, gestational age by last menstrual period 10 weeks and 1 day.  EXAM: OBSTETRIC <14 WK [place] AND TRANSVAGINAL OB US  TECHNIQUE: Both transabdominal and transvaginal ultrasound examinations were performed for complete evaluation of the gestation as well as the maternal uterus, adnexal regions, and pelvic cul-de-sac. Transvaginal technique was performed to assess early pregnancy.  COMPARISON:  None.  FINDINGS: Number of Fetuses: 1  Heart Rate:  144 bpm  Movement: Yes  Presentation: Cephalic  Placental Location: Posterior  Previa: No  Amniotic Fluid (Subjective): Within normal limits.  BPD:  6.6cm 24w  5d ; EDD Nov 14, 2014  MATERNAL FINDINGS:  Cervix:  Appears closed.  Uterus:  No acute abnormality visualized.  Adnexal not evaluated.  IMPRESSION: Single live intrauterine pregnancy, gestational age by ultrasound 24 weeks and 5 days, EDD Nov 14, 2014 without immediate complications.  This exam is performed on an emergent basis and does not  comprehensively evaluate fetal size, dating, or anatomy; follow-up complete OB US should be considered if further fetal assessment is warranted.   Electronically Signed   By: Awilda Metro   On: 07/30/2014 02:12    Assessment: No diagnosis found. 1. Cervicitis: 2/2 GC/Chlamydia infection. Likely the cause of patient's mucopurulent discharge.  2. Noncompliance: patient has had no PNC at this time. Has now been seen multiple times at the MAU. Has not set up any [facility] appt presently. Patient has also no filled any prescription at this time despite multiple MAU visits.  Plan: 1. Given Azythromycin PO and Ceftriaxone IM here in the MAU 2. Patient Strongly encouraged to call to make appt w/ [facility]; Message sent by provider to clinic after patient for future appointment. 3. Discharge home 4. Preterm labor precautions 5. Urine Culture    Medication List    ASK your doctor about these medications        albuterol 108 (90 BASE) MCG/ACT inhaler  Commonly known as:  PROVENTIL HFA;VENTOLIN HFA  Inhale 2 puffs into the lungs every 6 (six) hours as needed for wheezing.     metroNIDAZOLE 500 MG tablet  Commonly known as:  FLAGYL  Take 1 tablet (500 mg total) by mouth 2 (two) times daily.     ondansetron 4 MG disintegrating tablet  Commonly known as:  ZOFRAN ODT   ODT q4 hours prn nausea/vomit     PRENATAL COMPLETE 14-0.4 MG Tabs  Take 1 tablet by mouth daily.        Kathee Delton, MD 08/02/2014 7:26 AM  I was present for the exam and agree with above. In-basket massage sent to [facility] to scheduled NOB.  Bleeding precautions. Pelvic rest x 1 week. Partner needs Tx. No IC x 1 week after both treated.    [place], CNM 08/02/2014 9:25 AM

## 2014-08-02 NOTE — Discharge Instructions (Signed)
Prenatal Care  WHAT IS PRENATAL CARE?  Prenatal care means health care during your pregnancy, before your baby is born. It is very important to take care of yourself and your baby during your pregnancy by:   Getting early prenatal care. If you know you are pregnant, or think you might be pregnant, call your health care provider as soon as possible. Schedule a visit for a prenatal exam.  Getting regular prenatal care. Follow your health care provider's schedule for blood and other necessary tests. Do not miss appointments.  Doing everything you can to keep yourself and your baby healthy during your pregnancy.  Getting complete care. Prenatal care should include evaluation of the medical, dietary, educational, psychological, and social needs of you and your significant other. The medical and genetic history of your family and the family of your baby's father should be discussed with your health care provider.  Discussing with your health care provider:  Prescription, over-the-counter, and herbal medicines that you take.  Any history of substance abuse, alcohol use, smoking, and illegal drug use.  Any history of domestic abuse and violence.  Immunizations you have received.  Your nutrition and diet.  The amount of exercise you do.  Any environmental and occupational hazards to which you are exposed.  History of sexually transmitted infections for both you and your partner.  Previous pregnancies you have had. WHY IS PRENATAL CARE SO IMPORTANT?  By regularly seeing your health care provider, you help ensure that problems can be identified early so that they can be treated as soon as possible. Other problems might be prevented. Many studies have shown that early and regular prenatal care is important for the health of mothers and their babies.  HOW CAN I TAKE CARE OF MYSELF WHILE I AM PREGNANT?  Here are ways to take care of yourself and your baby:   Start or continue taking your  multivitamin with 400 micrograms (mcg) of folic acid every day.  Get early and regular prenatal care. It is very important to see a health care provider during your pregnancy. Your health care provider will check at each visit to make sure that you and your baby are healthy. If there are any problems, action can be taken right away to help you and your baby.  Eat a healthy diet that includes:  Fruits.  Vegetables.  Foods low in saturated fat.  Whole grains.  Calcium-rich foods, such as milk, yogurt, and hard cheeses.  Drink 6-8 glasses of liquids a day.  Unless your health care provider tells you not to, try to be physically active for 30 minutes, most days of the week. If you are pressed for time, you can get your activity in through 10-minute segments, three times a day.  Do not smoke, drink alcohol, or use drugs. These can cause long-term damage to your baby. Talk with your health care provider about steps to take to stop smoking. Talk with a member of your faith community, a counselor, a trusted friend, or your health care provider if you are concerned about your alcohol or drug use.  Ask your health care provider before taking any medicine, even over-the-counter medicines. Some medicines are not safe to take during pregnancy.  Get plenty of rest and sleep.  Avoid hot tubs and saunas during pregnancy.  Do not have X-rays taken unless absolutely necessary and with the recommendation of your health care provider. A lead shield can be placed on your abdomen to protect your baby when  X-rays are taken in other parts of your body. °· Do not empty the cat litter when you are pregnant. It may contain a parasite that causes an infection called toxoplasmosis, which can cause birth defects. Also, use gloves when working in garden areas used by cats. °· Do not eat uncooked or undercooked meats or fish. °· Do not eat soft, mold-ripened cheeses (Brie, Camembert, and chevre) or soft, blue-veined  cheese (Danish blue and Roquefort). °· Stay away from toxic chemicals like: °¨ Insecticides. °¨ Solvents (some cleaners or paint thinners). °¨ Lead. °¨ Mercury. °· Sexual intercourse may continue until the end of the pregnancy, unless you have a medical problem or there is a problem with the pregnancy and your health care provider tells you not to. °· Do not wear high-heel shoes, especially during the second half of the pregnancy. You can lose your balance and fall. °· Do not take long trips, unless absolutely necessary. Be sure to see your health care provider before going on the trip. °· Do not sit in one position for more than 2 hours when on a trip. °· Take a copy of your medical records when going on a trip. Know where a hospital is located in the city you are visiting, in case of an emergency. °· Most dangerous household products will have pregnancy warnings on their labels. Ask your health care provider about products if you are unsure. °· Limit or eliminate your caffeine intake from coffee, tea, sodas, medicines, and chocolate. °· Many women continue working through pregnancy. Staying active might help you stay healthier. If you have a question about the safety or the hours you work at your particular job, talk with your health care provider. °· Get informed: °¨ Read books. °¨ Watch videos. °¨ Go to childbirth classes for you and your significant other. °¨ Talk with experienced moms. °· Ask your health care provider about childbirth education classes for you and your partner. Classes can help you and your partner prepare for the birth of your baby. °· Ask about a baby doctor (pediatrician) and methods and pain medicine for labor, delivery, and possible cesarean delivery. °HOW OFTEN SHOULD I SEE MY HEALTH CARE PROVIDER DURING PREGNANCY?  °Your health care provider will give you a schedule for your prenatal visits. You will have visits more often as you get closer to the end of your pregnancy. An average  pregnancy lasts about 40 weeks.  °A typical schedule includes visiting your health care provider:  °· About once each month during your first 6 months of pregnancy. °· Every 2 weeks during the next 2 months. °· Weekly in the last month, until the delivery date. °Your health care provider will probably want to see you more often if: °· You are older than 35 years. °· Your pregnancy is high risk because you have certain health problems or problems with the pregnancy, such as: °¨ Diabetes. °¨ High blood pressure. °¨ The baby is not growing on schedule, according to the dates of the pregnancy. °Your health care provider will do special tests to make sure you and your baby are not having any serious problems. °WHAT HAPPENS DURING PRENATAL VISITS?  °· At your first prenatal visit, your health care provider will do a physical exam and talk to you about your health history and the health history of your partner and your family. Your health care provider will be able to tell you what date to expect your baby to be born on. °· Your   first physical exam will include checks of your blood pressure, measurements of your height and weight, and an exam of your pelvic organs. Your health care provider will do a Pap test if you have not had one recently and will do cultures of your cervix to make sure there is no infection.  At each prenatal visit, there will be tests of your blood, urine, blood pressure, weight, and the progress of the baby will be checked.  At your later prenatal visits, your health care provider will check how you are doing and how your baby is developing. You may have a number of tests done as your pregnancy progresses.  Ultrasound exams are often used to check on your baby's growth and health.  You may have more urine and blood tests, as well as special tests, if needed. These may include amniocentesis to examine fluid in the pregnancy sac, stress tests to check how the baby responds to contractions, or a  biophysical profile to measure your baby's well-being. Your health care provider will explain the tests and why they are necessary.  You should be tested for high blood sugar (gestational diabetes) between the 24th and 28th weeks of your pregnancy.  You should discuss with your health care provider your plans to breastfeed or bottle-feed your baby.  Each visit is also a chance for you to learn about staying healthy during pregnancy and to ask questions. Document Released: 07/03/2003 Document Revised: 07/05/2013 Document Reviewed: 09/14/2013 [facility] Patient Information 2015 [place], [place]. This information is not intended to replace advice given to you by your health care provider. Make sure you discuss any questions you have with your health care provider.   Chlamydia Chlamydia is an infection. It is spread from one person to another person during sexual contact. This infection can be in the cervix, urine tube (urethra), throat, or bottom (rectum). This infection needs treatment. HOME CARE   Take your medicines (antibiotics) as told. Finish them even if you start to feel better.  Only take medicine as told by your doctor.  Tell your sex partner(s) that you have chlamydia. They must also be treated.  Do not have sex until your doctor says it is okay.  Rest.  Eat healthy. Drink enough fluids to keep your pee (urine) clear or pale yellow.  Keep all doctor visits as told. GET HELP IF:  You have pain when you pee.  You have belly pain.  You have vaginal discharge.  You have pain during sex.  You have bleeding between periods and after sex.  You have a fever. GET HELP RIGHT AWAY IF:   You feel sick to your stomach (nauseous) or you throw up (vomit).  You sweat much more than normal (diaphoresis).  You have trouble swallowing. MAKE SURE YOU:   Understand these instructions.  Will watch your condition.  Will get help right away if you are not doing well or get  worse. Document Released: 04/08/2008 Document Revised: 11/14/2013 Document Reviewed: 03/07/2013 [facility] Patient Information 2015 [place], [place]. This information is not intended to replace advice given to you by your health care provider. Make sure you discuss any questions you have with your health care provider.  Gonorrhea Gonorrhea is an infection that can cause serious problems. If left untreated, the infection may:   Damage the female or female organs.   Cause women to be unable to have children (sterility).   Harm a fetus if the infected woman is pregnant.  It is important to get  treatment for gonorrhea as soon as possible. It is also necessary that all your sexual partners be tested for the infection.  CAUSES  Gonorrhea is caused by bacteria called Neisseria gonorrhoeae. The infection is spread from person to person, usually by sexual contact (such as by anal, vaginal, or oral means). A newborn can contract the infection from his or her mother during birth.  SYMPTOMS  Some people with gonorrhea do not have symptoms. Symptoms may be different in females and males.  Females The most common symptoms are:   Pain in the lower abdomen.   Fever with or without chills.  Other symptoms include:   Abnormal vaginal discharge.   Painful intercourse.   Burning or itching of the vagina or lips of the vagina.   Abnormal vaginal bleeding.   Pain when urinating.   Long-lasting (chronic) pain in the lower abdomen, especially during menstruation or intercourse.   Inability to become pregnant.   Going into premature labor.   Irritation, pain, bleeding, or discharge from the rectum. This may occur if the infection was spread by anal sex.   Sore throat or swollen lymph nodes in the neck. This may occur if the infection was spread by oral sex.  Males The most common symptoms are:   Discharge from the penis.   Pain or burning during urination.   Pain or swelling  in the testicles. Other symptoms may include:   Irritation, pain, bleeding, or discharge from the rectum. This may occur if the infection was spread by anal sex.   Sore throat, fever, or swollen lymph nodes in the neck. This may occur if the infection was spread by oral sex.  DIAGNOSIS  A diagnosis is made after a physical exam is done and a sample of discharge is examined under a microscope for the presence of the bacteria. The discharge may be taken from the urethra, cervix, throat, or rectum.  TREATMENT  Gonorrhea is treated with antibiotic medicines. It is important for treatment to begin as soon as possible. Early treatment may prevent some problems from developing.  HOME CARE INSTRUCTIONS   Take medicines only as directed by your health care provider.   Take your antibiotic medicine as directed by your health care provider. Finish the antibiotic even if you start to feel better. Incomplete treatment will put you at risk for continued infection.   Do not have sex until treatment is complete or as directed by your health care provider.   Keep all follow-up visits as directed by your health care provider.   Not all test results are available during your visit. If your test results are not back during the visit, make an appointment with your health care provider to find out the results. Do not assume everything is normal if you have not heard from your health care provider or the medical facility. It is your responsibility to get your test results.  If you test positive for gonorrhea, inform your recent sexual partners. They need to be checked for gonorrhea even if they do not have symptoms. They may need treatment, even if they test negative for gonorrhea.  SEEK MEDICAL CARE IF:   You develop any bad reaction to the medicine you were prescribed. This may include:   A rash.   Nausea.   Vomiting.   Diarrhea.   Your symptoms do not improve after a few days of taking  antibiotics.   Your symptoms get worse.   You develop increased pain, such as in  the testicles (for males) or in the abdomen (for females).  You have a fever. MAKE SURE YOU:   Understand these instructions.  Will watch your condition.  Will get help right away if you are not doing well or get worse. Document Released: 06/27/2000 Document Revised: 11/14/2013 Document Reviewed: 01/05/2013 [facility] Patient Information 2015 [place], [place]. This information is not intended to replace advice given to you by your health care provider. Make sure you discuss any questions you have with your health care provider.

## 2014-08-02 NOTE — MAU Note (Signed)
PT SAYS SHE WOKE  AT 0400  -  WENT  TO B-ROOM   SAW BLEEDING -   AND STARTED CRAMPING.   LAST  SEX-  1 WEEK AGO.    SAYS SHE CALLED  TODAY  TO GET AN APPOINTMENT  AT CLINIC-  BUT NO APPOINTMENT    IN TRIAGE-     NO PAD ON -   IN UNDERWEAR-  1 DIME SIZE  LIGHT  BROWN SPOT .

## 2014-08-03 LAB — CULTURE, OB URINE
Colony Count: 35000
SPECIAL REQUESTS: NORMAL

## 2014-08-07 ENCOUNTER — Other Ambulatory Visit: Payer: Self-pay

## 2014-08-07 LAB — OB RESULTS CONSOLE HIV ANTIBODY (ROUTINE TESTING): HIV: NONREACTIVE

## 2014-08-07 LAB — OB RESULTS CONSOLE RPR: RPR: NONREACTIVE

## 2014-08-08 LAB — CYTOLOGY - PAP

## 2014-08-13 NOTE — Progress Notes (Signed)
[facility]. No further CM needs identified.

## 2014-08-15 ENCOUNTER — Encounter: Payer: Managed Care, Other (non HMO) | Admitting: Advanced Practice Midwife

## 2014-08-22 ENCOUNTER — Other Ambulatory Visit: Payer: Self-pay | Admitting: Obstetrics & Gynecology

## 2014-09-11 ENCOUNTER — Encounter (HOSPITAL_COMMUNITY): Payer: Self-pay | Admitting: *Deleted

## 2014-09-11 ENCOUNTER — Inpatient Hospital Stay (HOSPITAL_COMMUNITY)
Admission: AD | Admit: 2014-09-11 | Discharge: 2014-09-11 | Disposition: A | Discharge status: Home / Self Care | Payer: Managed Care, Other (non HMO) | Source: Ambulatory Visit | Attending: Obstetrics & Gynecology | Admitting: Obstetrics & Gynecology

## 2014-09-11 DIAGNOSIS — Z3A31 31 weeks gestation of pregnancy: Secondary | ICD-10-CM

## 2014-09-11 DIAGNOSIS — M549 Dorsalgia, unspecified: Secondary | ICD-10-CM | POA: Diagnosis not present

## 2014-09-11 DIAGNOSIS — O4703 False labor before 37 completed weeks of gestation, third trimester: Secondary | ICD-10-CM

## 2014-09-11 DIAGNOSIS — Z3A30 30 weeks gestation of pregnancy: Secondary | ICD-10-CM | POA: Diagnosis not present

## 2014-09-11 DIAGNOSIS — O368130 Decreased fetal movements, third trimester, not applicable or unspecified: Secondary | ICD-10-CM | POA: Insufficient documentation

## 2014-09-11 LAB — URINALYSIS, ROUTINE W REFLEX MICROSCOPIC
Bilirubin Urine: NEGATIVE
Glucose, UA: NEGATIVE mg/dL
Hgb urine dipstick: NEGATIVE
Ketones, ur: 15 mg/dL — AB
NITRITE: NEGATIVE
PH: 7 (ref 5.0–8.0)
Protein, ur: NEGATIVE mg/dL
Specific Gravity, Urine: 1.02 (ref 1.005–1.030)
Urobilinogen, UA: 0.2 mg/dL (ref 0.0–1.0)

## 2014-09-11 LAB — URINE MICROSCOPIC-ADD ON

## 2014-09-11 LAB — FETAL FIBRONECTIN: Fetal Fibronectin: NEGATIVE

## 2014-09-11 MED ORDER — NIFEDIPINE 10 MG PO CAPS
10.0000 mg | ORAL_CAPSULE | ORAL | Status: DC | PRN
  Administered 2014-09-11: 10 mg via ORAL
  Filled 2014-09-11: qty 1

## 2014-09-11 MED ORDER — DEXTROSE 5 % IN LACTATED RINGERS IV BOLUS
1000.0000 mL | Freq: Once | INTRAVENOUS | Status: AC
Start: 2014-09-11 — End: 2014-09-12
  Administered 2014-09-11: 1000 mL via INTRAVENOUS

## 2014-09-11 MED ORDER — ACETAMINOPHEN 500 MG PO TABS
1000.0000 mg | ORAL_TABLET | Freq: Once | ORAL | Status: AC
  Administered 2014-09-11: 1000 mg via ORAL
  Filled 2014-09-11: qty 2

## 2014-09-11 MED ORDER — CYCLOBENZAPRINE HCL 10 MG PO TABS
10.0000 mg | ORAL_TABLET | Freq: Once | ORAL | Status: AC
  Administered 2014-09-11: 10 mg via ORAL
  Filled 2014-09-11: qty 1

## 2014-09-11 NOTE — MAU Provider Note (Signed)
History     CSN: [id number]  Arrival date and time: 09/11/14 [id number]   First Provider Initiated Contact with Patient 09/11/14 2023      Chief Complaint  Patient presents with  . Abdominal Pain  . Decreased Fetal Movement   HPIpt is [id number] pregnant [id number] with hx of 2nd delivery at 36 weeks and previoous pregnancy term Pt c/o of decreased fetal movement-"none" since Saturday pt had ctx Sat every 10 minutes- but decreased on Sunday Pt denies vaginal discharge, spotting or bleeding Pt denies complications with this pregnancy Denies UTI sx, constipation or diarrhea Last ate at 12:30pm- BBQ chips and water and ginger ale Has had water before she came here Pt c/o back pain all week and weekend Pt has not had an appetite and doesn't feel like eating anything   MAU Note 09/11/2014 7:51 PM    Expand All Collapse All   Lower abdominal pain on Saturday that was intermittent. Today has lower back and abdominal pain. Denies vaginal bleeding/LOF/discharge. Last felt baby move on Saturday.       Past Medical History  Diagnosis Date  . Abnormal Pap smear     LAST PAP 09/2010  . Preterm labor 2011  . Anemia     CHILDHOOD  . Headache(784.0)     FREQUENT  . Asthma 2010  . Seizures 2009    WITH MIGRAINE  . SVD (spontaneous vaginal delivery) 12/16/2012  . Wears glasses   . Gonorrhea 07/2013    Past Surgical History  Procedure Laterality Date  . Appendectomy  07/22/2013  . Laparoscopic appendectomy N/A 07/22/2013    Procedure: APPENDECTOMY LAPAROSCOPIC;  Surgeon: Wilmon ArmsMatthew K. Corliss Skainssuei, MD;  Location: [facility] OR;  Service: General;  Laterality: N/A;  . Laparoscopy N/A 07/22/2013    Procedure: LAPAROSCOPY DIAGNOSTIC;  Surgeon: Wilmon ArmsMatthew K. Corliss Skainssuei, MD;  Location: [facility] OR;  Service: General;  Laterality: N/A;    Family History  Problem Relation Age of Onset  . Hypertension Father   . Asthma Father   . Asthma Brother   . Arthritis Paternal Aunt     History  Substance Use Topics  . Smoking status: Never  Smoker   . Smokeless tobacco: Never Used  . Alcohol Use: No     Comment: 07/22/2013 "birthday parties, etc; a few times/yr"LAST   TIME1-2015    Allergies:  Allergies  Allergen Reactions  . Pineapple Extract Itching and Rash    Prescriptions prior to admission  Medication Sig Dispense Refill Last Dose  . albuterol (PROVENTIL HFA;VENTOLIN HFA) 108 (90 BASE) MCG/ACT inhaler Inhale 2 puffs into the lungs every 6 (six) hours as needed for wheezing. 1 Inhaler 0 Past Week at Unknown time  . Prenatal Vit-Fe Fumarate-FA (PRENATAL COMPLETE) 14-0.4 MG TABS Take 1 tablet by mouth daily. 30 each 0 09/10/2014 at Unknown time  . metroNIDAZOLE (FLAGYL) 500 MG tablet Take 1 tablet (500 mg total) by mouth 2 (two) times daily. (Patient not taking: Reported on 09/11/2014) 14 tablet 0 Completed Course at Unknown time  . ondansetron (ZOFRAN ODT) 4 MG disintegrating tablet 4mg  ODT q4 hours prn nausea/vomit (Patient not taking: Reported on 07/30/2014) 4 tablet 0 Not Taking at Unknown time    Review of Systems  Constitutional: Negative for fever and chills.  Gastrointestinal: Positive for nausea and abdominal pain. Negative for vomiting, diarrhea and constipation.  Genitourinary: Negative for dysuria.  Musculoskeletal: Positive for back pain.   Physical Exam   Blood pressure 114/62, pulse 78, temperature 98.2 F (36.8 C), temperature  source Oral, resp. rate 16, height  (1.651 m), weight 145 lb 9.6 oz (66.044 kg), last menstrual period 04/28/2014, SpO2 100 %, not currently breastfeeding.  Physical Exam  Nursing note and vitals reviewed. Constitutional: She is oriented to person, place, and time. She appears well-developed and well-nourished. No distress.  HENT:  Head: Normocephalic.  Eyes: Pupils are equal, round, and reactive to light.  Neck: Normal range of motion. Neck supple.  Cardiovascular: Normal rate.   Respiratory: Effort normal.  GI: Soft.  FHR 135 baseline with 10x10 accelerations- no ctx  noted; fetal movement audible and pt feels movement  Genitourinary:  Cervix 0.5cm 50% effaced  Musculoskeletal: Normal range of motion.  Neurological: She is alert and oriented to person, place, and time.  Skin: Skin is warm and dry.  Psychiatric: She has a normal mood and affect.    MAU Course  Procedures Tylenol  Flexeril  given for back pain with improvement in pain level Fetal fibronectin-neg Results for orders placed or performed during the hospital encounter of 09/11/14 (from the past 24 hour(s))  Urinalysis, Routine w reflex microscopic     Status: Abnormal   Collection Time: 09/11/14  7:57 PM  Result Value Ref Range   Color, Urine YELLOW YELLOW   APPearance HAZY (A) CLEAR   Specific Gravity, Urine 1.020 1.005 - 1.030   pH 7.0 5.0 - 8.0   Glucose, UA NEGATIVE NEGATIVE mg/dL   Hgb urine dipstick NEGATIVE NEGATIVE   Bilirubin Urine NEGATIVE NEGATIVE   Ketones, ur 15 (A) NEGATIVE mg/dL   Protein, ur NEGATIVE NEGATIVE mg/dL   Urobilinogen, UA 0.2 0.0 - 1.0 mg/dL   Nitrite NEGATIVE NEGATIVE   Leukocytes, UA MODERATE (A) NEGATIVE  Urine microscopic-add on     Status: Abnormal   Collection Time: 09/11/14  7:57 PM  Result Value Ref Range   Squamous Epithelial / LPF FEW (A) RARE   WBC, UA 3-6 <3 WBC/hpf   RBC / HPF 0-2 <3 RBC/hpf   Bacteria, UA RARE RARE   Urine-Other MUCOUS PRESENT   Fetal fibronectin     Status: None   Collection Time: 09/11/14  8:40 PM  Result Value Ref Range   Fetal Fibronectin NEGATIVE NEGATIVE  pt started having mild ctx every 3 to 4 minutes after having no ctx  Pt is feeling ctx now- discussed with Dr. Mora Appl Will hydrate and give Procardia  every  x 3 doses prn ctx.  Pt's contractions ceased after one dose Procardia  Dr. Mora Appl notified   Assessment and Plan  Preterm contractions- oral hydration Return if ctx Back pain in pregnancy- tylenol, heat, recommend pregnancy belt Keep OB appointment  [facility] 09/11/2014,  8:24 PM

## 2014-09-11 NOTE — MAU Note (Signed)
Lower abdominal pain on Saturday that was intermittent. Today has lower back and abdominal pain. Denies vaginal bleeding/LOF/discharge. Last felt baby move on Saturday.

## 2014-09-12 ENCOUNTER — Inpatient Hospital Stay (HOSPITAL_COMMUNITY)
Admission: AD | Admit: 2014-09-12 | Discharge: 2014-09-12 | Disposition: A | Discharge status: Home / Self Care | Payer: Managed Care, Other (non HMO) | Source: Ambulatory Visit | Attending: Obstetrics and Gynecology | Admitting: Obstetrics and Gynecology

## 2014-09-12 DIAGNOSIS — N949 Unspecified condition associated with female genital organs and menstrual cycle: Secondary | ICD-10-CM

## 2014-09-12 DIAGNOSIS — O9989 Other specified diseases and conditions complicating pregnancy, childbirth and the puerperium: Secondary | ICD-10-CM

## 2014-09-12 DIAGNOSIS — Z3A31 31 weeks gestation of pregnancy: Secondary | ICD-10-CM | POA: Diagnosis not present

## 2014-09-12 LAB — URINALYSIS, ROUTINE W REFLEX MICROSCOPIC
Bilirubin Urine: NEGATIVE
Glucose, UA: NEGATIVE mg/dL
Hgb urine dipstick: NEGATIVE
Ketones, ur: NEGATIVE mg/dL
NITRITE: NEGATIVE
PROTEIN: 30 mg/dL — AB
SPECIFIC GRAVITY, URINE: 1.01 (ref 1.005–1.030)
Urobilinogen, UA: 0.2 mg/dL (ref 0.0–1.0)
pH: 8 (ref 5.0–8.0)

## 2014-09-12 LAB — URINE MICROSCOPIC-ADD ON

## 2014-09-12 NOTE — Discharge Instructions (Signed)

## 2014-09-12 NOTE — Progress Notes (Signed)
D. Poe CNM in to see pt 

## 2014-09-12 NOTE — MAU Provider Note (Signed)
Chief Complaint: Pelvic Pain and Headache   First Provider Initiated Contact with Patient 09/12/14 1324     SUBJECTIVE HPI: Laurie FreibergShantana [name] is a 24 y.o. G4P2103 at 469w0d by LMP who presents with several days of worsening constant lower abdominal discomfort and pelvic pressure. Somewhat worsened by walking. No self treatment. Denies dysuria, hematuria, frequency or urgency of urination. Denies irritative vaginal discharge. Denies social concerns but has 3 young children. Good fetal movememt. No vaginal bleeding or leaking.  Past Medical History  Diagnosis Date  . Abnormal Pap smear     LAST PAP 09/2010  . Preterm labor 2011  . Anemia     CHILDHOOD  . Headache(784.0)     FREQUENT  . Asthma 2010  . Seizures 2009    WITH MIGRAINE  . SVD (spontaneous vaginal delivery) 12/16/2012  . Wears glasses   . Gonorrhea 07/2013   OB History  Gravida Para Term Preterm AB SAB TAB Ectopic Multiple Living  4 3 2 1  0 0 0 0 0 3    # Outcome Date GA Lbr Len/2nd Weight Sex Delivery Anes PTL Lv  4 Current           3 Term 12/16/12 2535w5d 05:50 / 00:12 3.16 kg (6 lb 15.5 oz) M Vag-Spont EPI  Y  2 Preterm 06/25/10 7592w0d 12:00 2.863 kg (6 lb 5 oz) F Vag-Spont EPI Y Y     Comments: IUGR; IOL  1 Term 08/18/07 4746w4d 05:00 2.977 kg (6 lb 9 oz) M Vag-Spont EPI  Y     Comments: No complications     Past Surgical History  Procedure Laterality Date  . Appendectomy  07/22/2013  . Laparoscopic appendectomy N/A 07/22/2013    Procedure: APPENDECTOMY LAPAROSCOPIC;  Surgeon: Wilmon ArmsMatthew K. Corliss Skainssuei, MD;  Location: [facility] OR;  Service: General;  Laterality: N/A;  . Laparoscopy N/A 07/22/2013    Procedure: LAPAROSCOPY DIAGNOSTIC;  Surgeon: Wilmon ArmsMatthew K. Corliss Skainssuei, MD;  Location: [facility] OR;  Service: General;  Laterality: N/A;   History   Social History  . Marital Status: Single    Spouse Name: N/A  . Number of Children: 2  . Years of Education: 13   Occupational History  . HOMEMAKER    Social History Main Topics  . Smoking status:  Never Smoker   . Smokeless tobacco: Never Used  . Alcohol Use: No     Comment: 07/22/2013 "birthday parties, etc; a few times/yr"LAST   TIME1-2015  . Drug Use: No  . Sexual Activity:    Partners: Male    Birth Control/ Protection: None   Other Topics Concern  . Not on file   Social History Narrative   No current facility-administered medications on file prior to encounter.   Current Outpatient Prescriptions on File Prior to Encounter  Medication Sig Dispense Refill  . albuterol (PROVENTIL HFA;VENTOLIN HFA) 108 (90 BASE) MCG/ACT inhaler Inhale 2 puffs into the lungs every 6 (six) hours as needed for wheezing. 1 Inhaler 0  . ondansetron (ZOFRAN ODT) 4 MG disintegrating tablet 4mg  ODT q4 hours prn nausea/vomit (Patient not taking: Reported on 07/30/2014) 4 tablet 0  . Prenatal Vit-Fe Fumarate-FA (PRENATAL COMPLETE) 14-0.4 MG TABS Take 1 tablet by mouth daily. 30 each 0   Allergies  Allergen Reactions  . Pineapple Extract Itching and Rash    ROS: Pertinent items in HPI  OBJECTIVE Blood pressure 103/54, pulse 88, temperature 98.4 F (36.9 C), temperature source Oral, resp. rate 18, last menstrual period 04/28/2014, SpO2 98 %, not  currently breastfeeding. GENERAL: Well-developed, well-nourished female, teary HEENT: Normocephalic HEART: normal rate RESP: normal effort ABDOMEN: Soft, minimal diffuse lower abdominal tenderness BACK: neg CVAT EXTREMITIES: Nontender, no edema NEURO: Alert and oriented  Dilation: Closed Effacement (%): Thick Station: Ballotable Exam by:: D Poe CNM  Cx posterior, ext 1, int FT to closed  FHR: 130 baseline, moderate variability, reactive Toco: slight UI   LAB RESULTS Results for orders placed or performed during the hospital encounter of 09/12/14 (from the past 24 hour(s))  Urinalysis, Routine w reflex microscopic     Status: Abnormal   Collection Time: 09/12/14 12:04 PM  Result Value Ref Range   Color, Urine YELLOW YELLOW   APPearance HAZY  (A) CLEAR   Specific Gravity, Urine 1.010 1.005 - 1.030   pH 8.0 5.0 - 8.0   Glucose, UA NEGATIVE NEGATIVE mg/dL   Hgb urine dipstick NEGATIVE NEGATIVE   Bilirubin Urine NEGATIVE NEGATIVE   Ketones, ur NEGATIVE NEGATIVE mg/dL   Protein, ur 30 (A) NEGATIVE mg/dL   Urobilinogen, UA 0.2 0.0 - 1.0 mg/dL   Nitrite NEGATIVE NEGATIVE   Leukocytes, UA MODERATE (A) NEGATIVE  Urine microscopic-add on     Status: Abnormal   Collection Time: 09/12/14 12:04 PM  Result Value Ref Range   Squamous Epithelial / LPF FEW (A) RARE   WBC, UA 3-6 <3 WBC/hpf   Bacteria, UA MANY (A) RARE   Urine-Other AMORPHOUS URATES/PHOSPHATES     IMAGING No results found.  MAU COURSE Declines any analgesia. Declines SW consult. Urine C&S sent Dr. Dareen Piano consulted  ASSESSMENT 1. Round ligament pain   Possible asymptomatic bacteruria 24 yo G4P2103 at 31 wk0d Reactive FHR  PLAN Discharge home with UTI precautions, increase fluids, and RLP relief measures    Medication List    STOP taking these medications        ondansetron 4 MG disintegrating tablet  Commonly known as:  ZOFRAN ODT      TAKE these medications        albuterol 108 (90 BASE) MCG/ACT inhaler  Commonly known as:  PROVENTIL HFA;VENTOLIN HFA  Inhale 2 puffs into the lungs every 6 (six) hours as needed for wheezing.     PRENATAL COMPLETE 14-0.4 MG Tabs  Take 1 tablet by mouth daily.       Follow-up Information    Follow up with Levi Aland, MD.   Specialty:  Obstetrics and Gynecology   Why:  Keep your scheduled prenatal appointment   Contact information:   [address] [place] [address] [phone]       [name], CNM 09/12/2014  1:26 PM

## 2014-09-12 NOTE — Progress Notes (Signed)
Written and verbal d/c instructions given and understanding voiced. 

## 2014-09-12 NOTE — Progress Notes (Signed)
Laurie [name] offered pt pain medicine but does not want any medicine. Pt teary eyed and offered social worker to talk with pt but pt declined.

## 2014-09-12 NOTE — MAU Note (Signed)
Pt seen in MAU yesterday for preterm uc's,  Pt having pelvic pain & pressure, also having a HA.  Denies bleeding or LOF.

## 2014-09-13 LAB — CULTURE, OB URINE

## 2014-09-20 ENCOUNTER — Encounter (HOSPITAL_COMMUNITY): Payer: Self-pay | Admitting: *Deleted

## 2014-09-20 ENCOUNTER — Inpatient Hospital Stay (HOSPITAL_COMMUNITY)
Admission: AD | Admit: 2014-09-20 | Discharge: 2014-09-21 | Disposition: A | Discharge status: Home / Self Care | Payer: Managed Care, Other (non HMO) | Source: Ambulatory Visit | Attending: Obstetrics | Admitting: Obstetrics

## 2014-09-20 DIAGNOSIS — O4693 Antepartum hemorrhage, unspecified, third trimester: Secondary | ICD-10-CM | POA: Insufficient documentation

## 2014-09-20 DIAGNOSIS — Z3A32 32 weeks gestation of pregnancy: Secondary | ICD-10-CM | POA: Diagnosis not present

## 2014-09-20 DIAGNOSIS — Z3A31 31 weeks gestation of pregnancy: Secondary | ICD-10-CM

## 2014-09-20 DIAGNOSIS — N930 Postcoital and contact bleeding: Secondary | ICD-10-CM

## 2014-09-20 LAB — WET PREP, GENITAL
CLUE CELLS WET PREP: NONE SEEN
Trich, Wet Prep: NONE SEEN
YEAST WET PREP: NONE SEEN

## 2014-09-20 NOTE — MAU Note (Signed)
Pt started bleeding this evening in shower. Cramping in lower abd.

## 2014-09-20 NOTE — MAU Provider Note (Signed)
History     CSN: [id number]  Arrival date and time: 09/20/14 2238   First Provider Initiated Contact with Patient 09/20/14 2311      No chief complaint on file.  HPI  Laurie [name] is a 24 y.o. [phone] at [redacted]w[redacted]d who presents today with bleeding. She states that she started bleeding at 2100. She denies any contractions, but does have some lower abdominal pressure. She denies any LOF and states that the fetus has been active. She states that she has had intercourse today, and the bleeding started about 30 mins after intercourse. She denies any complications with the pregnancy. Record review shows no placenta previa.   Past Medical History  Diagnosis Date  . Abnormal Pap smear     LAST PAP 09/2010  . Preterm labor 2011  . Anemia     CHILDHOOD  . Headache(784.0)     FREQUENT  . Asthma 2010  . Seizures 2009    WITH MIGRAINE  . SVD (spontaneous vaginal delivery) 12/16/2012  . Wears glasses   . Gonorrhea 07/2013    Past Surgical History  Procedure Laterality Date  . Appendectomy  07/22/2013  . Laparoscopic appendectomy N/A 07/22/2013    Procedure: APPENDECTOMY LAPAROSCOPIC;  Surgeon: Wilmon Arms. Corliss Skains, MD;  Location: [facility] OR;  Service: General;  Laterality: N/A;  . Laparoscopy N/A 07/22/2013    Procedure: LAPAROSCOPY DIAGNOSTIC;  Surgeon: Wilmon Arms. Corliss Skains, MD;  Location: [facility] OR;  Service: General;  Laterality: N/A;    Family History  Problem Relation Age of Onset  . Hypertension Father   . Asthma Father   . Asthma Brother   . Arthritis Paternal Aunt     History  Substance Use Topics  . Smoking status: Never Smoker   . Smokeless tobacco: Never Used  . Alcohol Use: No     Comment: 07/22/2013 "birthday parties, etc; a few times/yr"LAST   TIME1-2015    Allergies: No Known Allergies  No prescriptions prior to admission    Review of Systems  Constitutional: Negative.   HENT: Negative.   Eyes: Negative.   Respiratory: Negative.   Cardiovascular: Negative.   Gastrointestinal:  Positive for abdominal pain ("pressure in lower abdomen"). Negative for nausea, vomiting, diarrhea and constipation.  Genitourinary: Negative.   Musculoskeletal: Negative.   Skin: Negative.   Neurological: Negative.   Endo/Heme/Allergies: Negative.   Psychiatric/Behavioral: Negative.    Physical Exam   Blood pressure 102/49, pulse 86, temperature 97.6 F (36.4 C), temperature source Oral, resp. rate 16, height  (1.651 m), weight 66.86 kg (147 lb 6.4 oz), last menstrual period 04/28/2014, not currently breastfeeding.  Physical Exam  Nursing note and vitals reviewed. Constitutional: She is oriented to person, place, and time. She appears well-developed. No distress.  Cardiovascular: Normal rate.   Respiratory: Effort normal.  GI: Soft. There is no tenderness. There is no rebound.  Genitourinary:   External: no lesion Vagina: small amount of brown mucous  Cervix: pink, smooth, 1 at ext os/closed at int os/thick/high, presenting part out of the pelvis.  Uterus: AGA    Neurological: She is alert and oriented to person, place, and time.  Skin: Skin is warm and dry.  Psychiatric: She has a normal mood and affect.   FHT 150, moderate with 15x15 accels, no decels Toco: no UCs   MAU Course  Procedures  Results for orders placed or performed during the hospital encounter of 09/20/14 (from the past 24 hour(s))  Urinalysis, Routine w reflex microscopic  Status: Abnormal   Collection Time: 09/20/14 10:50 PM  Result Value Ref Range   Color, Urine YELLOW YELLOW   APPearance HAZY (A) CLEAR   Specific Gravity, Urine 1.020 1.005 - 1.030   pH 7.0 5.0 - 8.0   Glucose, UA NEGATIVE NEGATIVE mg/dL   Hgb urine dipstick LARGE (A) NEGATIVE   Bilirubin Urine NEGATIVE NEGATIVE   Ketones, ur NEGATIVE NEGATIVE mg/dL   Protein, ur NEGATIVE NEGATIVE mg/dL   Urobilinogen, UA 0.2 0.0 - 1.0 mg/dL   Nitrite NEGATIVE NEGATIVE   Leukocytes, UA NEGATIVE NEGATIVE  Urine microscopic-add on      Status: Abnormal   Collection Time: 09/20/14 10:50 PM  Result Value Ref Range   Squamous Epithelial / LPF FEW (A) RARE   WBC, UA 0-2 <3 WBC/hpf   RBC / HPF 3-6 <3 RBC/hpf   Bacteria, UA RARE RARE  Wet prep, genital     Status: Abnormal   Collection Time: 09/20/14 11:38 PM  Result Value Ref Range   Yeast Wet Prep HPF POC NONE SEEN NONE SEEN   Trich, Wet Prep NONE SEEN NONE SEEN   Clue Cells Wet Prep HPF POC NONE SEEN NONE SEEN   WBC, Wet Prep HPF POC MODERATE (A) NONE SEEN   2354: D/W Dr. Chestine Sporelark ok for dc home.  Assessment and Plan   1. PCB (post coital bleeding)   2. Vaginal bleeding in pregnancy, third trimester    DC home Bleeding precautions PTL signs Fetal kick counts Return to MAU as needed Follow up with the office as planned  Follow-up Information    [facility] LeydenGEFFEL CLARK, MD.   Specialty:  Obstetrics   Why:  As scheduled   Contact information:   [address] [place] [address] [phone]        Tawnya CrookHogan, Sharilynn Cassity Donovan 09/21/2014, 12:42 AM

## 2014-09-20 NOTE — MAU Note (Signed)
Pt took self off monitor and got dressed. States she "doesn't need to be on the monitor anymore".

## 2014-09-20 NOTE — Discharge Instructions (Signed)
Third Trimester of Pregnancy The third trimester is from week 29 through week 42, months 7 through 9. The third trimester is a time when the fetus is growing rapidly. At the end of the ninth month, the fetus is about 20 inches in length and weighs 6-10 pounds.  BODY CHANGES Your body goes through many changes during pregnancy. The changes vary from woman to woman.   Your weight will continue to increase. You can expect to gain 25-35 pounds (11-16 kg) by the end of the pregnancy.  You may begin to get stretch marks on your hips, abdomen, and breasts.  You may urinate more often because the fetus is moving lower into your pelvis and pressing on your bladder.  You may develop or continue to have heartburn as a result of your pregnancy.  You may develop constipation because certain hormones are causing the muscles that push waste through your intestines to slow down.  You may develop hemorrhoids or swollen, bulging veins (varicose veins).  You may have pelvic pain because of the weight gain and pregnancy hormones relaxing your joints between the bones in your pelvis. Backaches may result from overexertion of the muscles supporting your posture.  You may have changes in your hair. These can include thickening of your hair, rapid growth, and changes in texture. Some women also have hair loss during or after pregnancy, or hair that feels dry or thin. Your hair will most likely return to normal after your baby is born.  Your breasts will continue to grow and be tender. A yellow discharge may leak from your breasts called colostrum.  Your belly button may stick out.  You may feel short of breath because of your expanding uterus.  You may notice the fetus "dropping," or moving lower in your abdomen.  You may have a bloody mucus discharge. This usually occurs a few days to a week before labor begins.  Your cervix becomes thin and soft (effaced) near your due date. WHAT TO EXPECT AT YOUR PRENATAL  EXAMS  You will have prenatal exams every 2 weeks until week 36. Then, you will have weekly prenatal exams. During a routine prenatal visit:  You will be weighed to make sure you and the fetus are growing normally.  Your blood pressure is taken.  Your abdomen will be measured to track your baby's growth.  The fetal heartbeat will be listened to.  Any test results from the previous visit will be discussed.  You may have a cervical check near your due date to see if you have effaced. At around 36 weeks, your caregiver will check your cervix. At the same time, your caregiver will also perform a test on the secretions of the vaginal tissue. This test is to determine if a type of bacteria, Group B streptococcus, is present. Your caregiver will explain this further. Your caregiver may ask you:  What your birth plan is.  How you are feeling.  If you are feeling the baby move.  If you have had any abnormal symptoms, such as leaking fluid, bleeding, severe headaches, or abdominal cramping.  If you have any questions. Other tests or screenings that may be performed during your third trimester include:  Blood tests that check for low iron levels (anemia).  Fetal testing to check the health, activity level, and growth of the fetus. Testing is done if you have certain medical conditions or if there are problems during the pregnancy. FALSE LABOR You may feel small, irregular contractions that   eventually go away. These are called Braxton Hicks contractions, or false labor. Contractions may last for hours, days, or even weeks before true labor sets in. If contractions come at regular intervals, intensify, or become painful, it is best to be seen by your caregiver.  SIGNS OF LABOR   Menstrual-like cramps.  Contractions that are 5 minutes apart or less.  Contractions that start on the top of the uterus and spread down to the lower abdomen and back.  A sense of increased pelvic pressure or back  pain.  A watery or bloody mucus discharge that comes from the vagina. If you have any of these signs before the 37th week of pregnancy, call your caregiver right away. You need to go to the hospital to get checked immediately. HOME CARE INSTRUCTIONS   Avoid all smoking, herbs, alcohol, and unprescribed drugs. These chemicals affect the formation and growth of the baby.  Follow your caregiver's instructions regarding medicine use. There are medicines that are either safe or unsafe to take during pregnancy.  Exercise only as directed by your caregiver. Experiencing uterine cramps is a good sign to stop exercising.  Continue to eat regular, healthy meals.  Wear a good support bra for breast tenderness.  Do not use hot tubs, steam rooms, or saunas.  Wear your seat belt at all times when driving.  Avoid raw meat, uncooked cheese, cat litter boxes, and soil used by cats. These carry germs that can cause birth defects in the baby.  Take your prenatal vitamins.  Try taking a stool softener (if your caregiver approves) if you develop constipation. Eat more high-fiber foods, such as fresh vegetables or fruit and whole grains. Drink plenty of fluids to keep your urine clear or pale yellow.  Take warm sitz baths to soothe any pain or discomfort caused by hemorrhoids. Use hemorrhoid cream if your caregiver approves.  If you develop varicose veins, wear support hose. Elevate your feet for 15 minutes, 3-4 times a day. Limit salt in your diet.  Avoid heavy lifting, wear low heal shoes, and practice good posture.  Rest a lot with your legs elevated if you have leg cramps or low back pain.  Visit your dentist if you have not gone during your pregnancy. Use a soft toothbrush to brush your teeth and be gentle when you floss.  A sexual relationship may be continued unless your caregiver directs you otherwise.  Do not travel far distances unless it is absolutely necessary and only with the approval  of your caregiver.  Take prenatal classes to understand, practice, and ask questions about the labor and delivery.  Make a trial run to the hospital.  Pack your hospital bag.  Prepare the baby's nursery.  Continue to go to all your prenatal visits as directed by your caregiver. SEEK MEDICAL CARE IF:  You are unsure if you are in labor or if your water has broken.  You have dizziness.  You have mild pelvic cramps, pelvic pressure, or nagging pain in your abdominal area.  You have persistent nausea, vomiting, or diarrhea.  You have a bad smelling vaginal discharge.  You have pain with urination. SEEK IMMEDIATE MEDICAL CARE IF:   You have a fever.  You are leaking fluid from your vagina.  You have spotting or bleeding from your vagina.  You have severe abdominal cramping or pain.  You have rapid weight loss or gain.  You have shortness of breath with chest pain.  You notice sudden or extreme swelling   of your face, hands, ankles, feet, or legs.  You have not felt your baby move in over an hour.  You have severe headaches that do not go away with medicine.  You have vision changes. Document Released: 06/24/2001 Document Revised: 07/05/2013 Document Reviewed: 08/31/2012 ExitCare Patient Information 2015 [employer]. This information is not intended to replace advice given to you by your health care provider. Make sure you discuss any questions you have with your health care provider.  

## 2014-09-20 NOTE — MAU Note (Signed)
Pt had intercourse, less than one hour later had bleeding.

## 2014-09-21 DIAGNOSIS — O4693 Antepartum hemorrhage, unspecified, third trimester: Secondary | ICD-10-CM | POA: Diagnosis not present

## 2014-09-21 LAB — URINALYSIS, ROUTINE W REFLEX MICROSCOPIC
Bilirubin Urine: NEGATIVE
Glucose, UA: NEGATIVE mg/dL
KETONES UR: NEGATIVE mg/dL
Leukocytes, UA: NEGATIVE
Nitrite: NEGATIVE
Protein, ur: NEGATIVE mg/dL
SPECIFIC GRAVITY, URINE: 1.02 (ref 1.005–1.030)
UROBILINOGEN UA: 0.2 mg/dL (ref 0.0–1.0)
pH: 7 (ref 5.0–8.0)

## 2014-09-21 LAB — URINE MICROSCOPIC-ADD ON

## 2014-10-04 ENCOUNTER — Inpatient Hospital Stay (HOSPITAL_COMMUNITY)
Admission: AD | Admit: 2014-10-04 | Discharge: 2014-10-04 | Disposition: A | Discharge status: Home / Self Care | Payer: Managed Care, Other (non HMO) | Source: Ambulatory Visit | Attending: Obstetrics and Gynecology | Admitting: Obstetrics and Gynecology

## 2014-10-04 ENCOUNTER — Encounter (HOSPITAL_COMMUNITY): Payer: Self-pay | Admitting: *Deleted

## 2014-10-04 DIAGNOSIS — O365930 Maternal care for other known or suspected poor fetal growth, third trimester, not applicable or unspecified: Secondary | ICD-10-CM | POA: Diagnosis present

## 2014-10-04 DIAGNOSIS — Z3A35 35 weeks gestation of pregnancy: Secondary | ICD-10-CM | POA: Insufficient documentation

## 2014-10-04 MED ORDER — BETAMETHASONE SOD PHOS & ACET 6 (3-3) MG/ML IJ SUSP
12.0000 mg | Freq: Once | INTRAMUSCULAR | Status: AC
  Administered 2014-10-04: 12 mg via INTRAMUSCULAR
  Filled 2014-10-04: qty 2

## 2014-10-04 NOTE — MAU Note (Signed)
Was sent by MD for NST and Betamethasone due to IUGR and increased fetal dopplers.

## 2014-10-04 NOTE — Discharge Instructions (Signed)
Return tomorrow around 3:30PM for 2nd injection and NST.

## 2014-10-05 ENCOUNTER — Inpatient Hospital Stay (HOSPITAL_COMMUNITY)
Admission: AD | Admit: 2014-10-05 | Discharge: 2014-10-05 | Disposition: A | Discharge status: Home / Self Care | Payer: Managed Care, Other (non HMO) | Source: Ambulatory Visit | Attending: Obstetrics and Gynecology | Admitting: Obstetrics and Gynecology

## 2014-10-05 DIAGNOSIS — Z3A24 24 weeks gestation of pregnancy: Secondary | ICD-10-CM | POA: Diagnosis not present

## 2014-10-05 MED ORDER — BETAMETHASONE SOD PHOS & ACET 6 (3-3) MG/ML IJ SUSP
12.0000 mg | Freq: Once | INTRAMUSCULAR | Status: AC
  Administered 2014-10-05: 12 mg via INTRAMUSCULAR
  Filled 2014-10-05: qty 2

## 2014-10-05 NOTE — MAU Note (Signed)
Pt here for second betamethasone and NST. C/ mild cramping

## 2014-10-11 ENCOUNTER — Other Ambulatory Visit: Payer: Self-pay

## 2014-10-11 LAB — OB RESULTS CONSOLE GBS: STREP GROUP B AG: NEGATIVE

## 2014-10-11 LAB — OB RESULTS CONSOLE GC/CHLAMYDIA
Chlamydia: NEGATIVE
Gonorrhea: NEGATIVE

## 2014-10-17 ENCOUNTER — Encounter (HOSPITAL_COMMUNITY): Payer: Self-pay | Admitting: *Deleted

## 2014-10-17 ENCOUNTER — Inpatient Hospital Stay (HOSPITAL_COMMUNITY)
Admission: AD | Admit: 2014-10-17 | Discharge: 2014-10-17 | Disposition: A | Discharge status: Home / Self Care | Payer: Managed Care, Other (non HMO) | Source: Ambulatory Visit | Attending: Obstetrics | Admitting: Obstetrics

## 2014-10-17 DIAGNOSIS — M545 Low back pain: Secondary | ICD-10-CM | POA: Insufficient documentation

## 2014-10-17 DIAGNOSIS — Z3A36 36 weeks gestation of pregnancy: Secondary | ICD-10-CM | POA: Diagnosis not present

## 2014-10-17 LAB — URINALYSIS, ROUTINE W REFLEX MICROSCOPIC
BILIRUBIN URINE: NEGATIVE
Glucose, UA: NEGATIVE mg/dL
KETONES UR: NEGATIVE mg/dL
NITRITE: NEGATIVE
Protein, ur: NEGATIVE mg/dL
Specific Gravity, Urine: 1.02 (ref 1.005–1.030)
Urobilinogen, UA: 0.2 mg/dL (ref 0.0–1.0)
pH: 7 (ref 5.0–8.0)

## 2014-10-17 LAB — URINE MICROSCOPIC-ADD ON

## 2014-10-17 NOTE — Discharge Instructions (Signed)
Braxton Hicks Contractions °Contractions of the uterus can occur throughout pregnancy. Contractions are not always a sign that you are in labor.  °WHAT ARE BRAXTON HICKS CONTRACTIONS?  °Contractions that occur before labor are called Braxton Hicks contractions, or false labor. Toward the end of pregnancy (32-34 weeks), these contractions can develop more often and may become more forceful. This is not true labor because these contractions do not result in opening (dilatation) and thinning of the cervix. They are sometimes difficult to tell apart from true labor because these contractions can be forceful and people have different pain tolerances. You should not feel embarrassed if you go to the hospital with false labor. Sometimes, the only way to tell if you are in true labor is for your health care provider to look for changes in the cervix. °If there are no prenatal problems or other health problems associated with the pregnancy, it is completely safe to be sent home with false labor and await the onset of true labor. °HOW CAN YOU TELL THE DIFFERENCE BETWEEN TRUE AND FALSE LABOR? °False Labor °· The contractions of false labor are usually shorter and not as hard as those of true labor.   °· The contractions are usually irregular.   °· The contractions are often felt in the front of the lower abdomen and in the groin.   °· The contractions may go away when you walk around or change positions while lying down.   °· The contractions get weaker and are shorter lasting as time goes on.   °· The contractions do not usually become progressively stronger, regular, and closer together as with true labor.   °True Labor °· Contractions in true labor last 30-70 seconds, become very regular, usually become more intense, and increase in frequency.   °· The contractions do not go away with walking.   °· The discomfort is usually felt in the top of the uterus and spreads to the lower abdomen and low back.   °· True labor can be  determined by your health care provider with an exam. This will show that the cervix is dilating and getting thinner.   °WHAT TO REMEMBER °· Keep up with your usual exercises and follow other instructions given by your health care provider.   °· Take medicines as directed by your health care provider.   °· Keep your regular prenatal appointments.   °· Eat and drink lightly if you think you are going into labor.   °· If Braxton Hicks contractions are making you uncomfortable:   °¨ Change your position from lying down or resting to walking, or from walking to resting.   °¨ Sit and rest in a tub of warm water.   °¨ Drink 2-3 glasses of water. Dehydration may cause these contractions.   °¨ Do slow and deep breathing several times an hour.   °WHEN SHOULD I SEEK IMMEDIATE MEDICAL CARE? °Seek immediate medical care if: °· Your contractions become stronger, more regular, and closer together.   °· You have fluid leaking or gushing from your vagina.   °· You have a fever.   °· You pass blood-tinged mucus.   °· You have vaginal bleeding.   °· You have continuous abdominal pain.   °· You have low back pain that you never had before.   °· You feel your baby's head pushing down and causing pelvic pressure.   °· Your baby is not moving as much as it used to.   °Document Released: 06/30/2005 Document Revised: 07/05/2013 Document Reviewed: 04/11/2013 °ExitCare® Patient Information ©2015 [employer]. This information is not intended to replace advice given to you by your health care   provider. Make sure you discuss any questions you have with your health care provider. ° °

## 2014-10-17 NOTE — MAU Note (Signed)
Pt reports a lot of contractions for the last 3 day, lower back pain today and a brownish discharge today.

## 2014-10-20 ENCOUNTER — Other Ambulatory Visit (HOSPITAL_COMMUNITY): Payer: Self-pay | Admitting: Obstetrics and Gynecology

## 2014-10-20 DIAGNOSIS — O351XX9 Maternal care for (suspected) chromosomal abnormality in fetus, other fetus: Secondary | ICD-10-CM

## 2014-10-20 DIAGNOSIS — O3510X9 Maternal care for (suspected) chromosomal abnormality in fetus, unspecified, other fetus: Secondary | ICD-10-CM

## 2014-10-25 ENCOUNTER — Inpatient Hospital Stay (HOSPITAL_COMMUNITY): Payer: Managed Care, Other (non HMO) | Admitting: Anesthesiology

## 2014-10-25 ENCOUNTER — Encounter (HOSPITAL_COMMUNITY): Payer: Self-pay | Admitting: *Deleted

## 2014-10-25 ENCOUNTER — Ambulatory Visit (HOSPITAL_COMMUNITY)
Admission: RE | Admit: 2014-10-25 | Discharge: 2014-10-25 | Disposition: A | Discharge status: Home / Self Care | Payer: Managed Care, Other (non HMO) | Source: Ambulatory Visit | Attending: Obstetrics and Gynecology | Admitting: Obstetrics and Gynecology

## 2014-10-25 ENCOUNTER — Other Ambulatory Visit (HOSPITAL_COMMUNITY): Payer: Self-pay | Admitting: Obstetrics and Gynecology

## 2014-10-25 ENCOUNTER — Inpatient Hospital Stay (HOSPITAL_COMMUNITY)
Admission: AD | Admit: 2014-10-25 | Discharge: 2014-10-28 | DRG: 775 | Disposition: A | Discharge status: Home / Self Care | Payer: Managed Care, Other (non HMO) | Source: Ambulatory Visit | Attending: Obstetrics | Admitting: Obstetrics

## 2014-10-25 DIAGNOSIS — O365990 Maternal care for other known or suspected poor fetal growth, unspecified trimester, not applicable or unspecified: Secondary | ICD-10-CM

## 2014-10-25 DIAGNOSIS — Z3A37 37 weeks gestation of pregnancy: Secondary | ICD-10-CM | POA: Insufficient documentation

## 2014-10-25 DIAGNOSIS — O365930 Maternal care for other known or suspected poor fetal growth, third trimester, not applicable or unspecified: Principal | ICD-10-CM | POA: Diagnosis present

## 2014-10-25 DIAGNOSIS — O351XX9 Maternal care for (suspected) chromosomal abnormality in fetus, other fetus: Secondary | ICD-10-CM

## 2014-10-25 DIAGNOSIS — O9952 Diseases of the respiratory system complicating childbirth: Secondary | ICD-10-CM | POA: Diagnosis present

## 2014-10-25 DIAGNOSIS — O3510X9 Maternal care for (suspected) chromosomal abnormality in fetus, unspecified, other fetus: Secondary | ICD-10-CM

## 2014-10-25 DIAGNOSIS — J45909 Unspecified asthma, uncomplicated: Secondary | ICD-10-CM | POA: Diagnosis present

## 2014-10-25 DIAGNOSIS — Z3483 Encounter for supervision of other normal pregnancy, third trimester: Secondary | ICD-10-CM | POA: Diagnosis present

## 2014-10-25 LAB — CBC
HEMATOCRIT: 32.1 % — AB (ref 36.0–46.0)
Hemoglobin: 10.5 g/dL — ABNORMAL LOW (ref 12.0–15.0)
MCH: 28.9 pg (ref 26.0–34.0)
MCHC: 32.7 g/dL (ref 30.0–36.0)
MCV: 88.4 fL (ref 78.0–100.0)
PLATELETS: 181 10*3/uL (ref 150–400)
RBC: 3.63 MIL/uL — ABNORMAL LOW (ref 3.87–5.11)
RDW: 13.7 % (ref 11.5–15.5)
WBC: 9.6 10*3/uL (ref 4.0–10.5)

## 2014-10-25 LAB — ABO/RH: ABO/RH(D): O POS

## 2014-10-25 LAB — TYPE AND SCREEN
ABO/RH(D): O POS
Antibody Screen: NEGATIVE

## 2014-10-25 MED ORDER — LACTATED RINGERS IV SOLN
500.0000 mL | INTRAVENOUS | Status: DC | PRN
  Administered 2014-10-26: 300 mL via INTRAVENOUS

## 2014-10-25 MED ORDER — OXYTOCIN 40 UNITS IN LACTATED RINGERS INFUSION - SIMPLE MED: 62.5000 mL/h | INTRAVENOUS | Status: DC

## 2014-10-25 MED ORDER — TERBUTALINE SULFATE 1 MG/ML IJ SOLN: 0.2500 mg | Freq: Once | INTRAMUSCULAR | Status: AC | PRN

## 2014-10-25 MED ORDER — OXYTOCIN BOLUS FROM INFUSION
500.0000 mL | INTRAVENOUS | Status: DC
  Administered 2014-10-26: 500 mL via INTRAVENOUS

## 2014-10-25 MED ORDER — LIDOCAINE HCL (PF) 1 % IJ SOLN
30.0000 mL | INTRAMUSCULAR | Status: DC | PRN
  Filled 2014-10-25: qty 30

## 2014-10-25 MED ORDER — PHENYLEPHRINE 40 MCG/ML (10ML) SYRINGE FOR IV PUSH (FOR BLOOD PRESSURE SUPPORT)
80.0000 ug | PREFILLED_SYRINGE | INTRAVENOUS | Status: DC | PRN
  Filled 2014-10-25: qty 20
  Filled 2014-10-25: qty 2

## 2014-10-25 MED ORDER — OXYTOCIN 40 UNITS IN LACTATED RINGERS INFUSION - SIMPLE MED
1.0000 m[IU]/min | INTRAVENOUS | Status: DC
Start: 2014-10-25 — End: 2014-10-26
  Administered 2014-10-25: 2 m[IU]/min via INTRAVENOUS
  Filled 2014-10-25: qty 1000

## 2014-10-25 MED ORDER — BUTORPHANOL TARTRATE 1 MG/ML IJ SOLN: 1.0000 mg | INTRAMUSCULAR | Status: DC | PRN

## 2014-10-25 MED ORDER — ONDANSETRON HCL 4 MG/2ML IJ SOLN
4.0000 mg | Freq: Four times a day (QID) | INTRAMUSCULAR | Status: DC | PRN
  Administered 2014-10-26: 4 mg via INTRAVENOUS
  Filled 2014-10-25: qty 2

## 2014-10-25 MED ORDER — EPHEDRINE 5 MG/ML INJ
10.0000 mg | INTRAVENOUS | Status: DC | PRN
  Filled 2014-10-25: qty 2

## 2014-10-25 MED ORDER — OXYCODONE-ACETAMINOPHEN 5-325 MG PO TABS: 1.0000 | ORAL_TABLET | ORAL | Status: DC | PRN

## 2014-10-25 MED ORDER — LACTATED RINGERS IV SOLN
500.0000 mL | Freq: Once | INTRAVENOUS | Status: AC
  Administered 2014-10-25: 500 mL via INTRAVENOUS

## 2014-10-25 MED ORDER — PHENYLEPHRINE 40 MCG/ML (10ML) SYRINGE FOR IV PUSH (FOR BLOOD PRESSURE SUPPORT)
80.0000 ug | PREFILLED_SYRINGE | INTRAVENOUS | Status: DC | PRN
  Filled 2014-10-25: qty 2

## 2014-10-25 MED ORDER — LACTATED RINGERS IV SOLN
INTRAVENOUS | Status: DC
  Administered 2014-10-25 (×2): via INTRAVENOUS

## 2014-10-25 MED ORDER — OXYCODONE-ACETAMINOPHEN 5-325 MG PO TABS: 2.0000 | ORAL_TABLET | ORAL | Status: DC | PRN

## 2014-10-25 MED ORDER — CITRIC ACID-SODIUM CITRATE 334-500 MG/5ML PO SOLN: 30.0000 mL | ORAL | Status: DC | PRN

## 2014-10-25 MED ORDER — FENTANYL 2.5 MCG/ML BUPIVACAINE 1/10 % EPIDURAL INFUSION (WH - ANES)
14.0000 mL/h | INTRAMUSCULAR | Status: DC | PRN
  Administered 2014-10-25: 14 mL/h via EPIDURAL
  Filled 2014-10-25: qty 125

## 2014-10-25 MED ORDER — ACETAMINOPHEN 325 MG PO TABS
650.0000 mg | ORAL_TABLET | ORAL | Status: DC | PRN
  Administered 2014-10-26: 650 mg via ORAL
  Filled 2014-10-25: qty 2

## 2014-10-25 MED ORDER — LIDOCAINE HCL (PF) 1 % IJ SOLN
INTRAMUSCULAR | Status: DC | PRN
  Administered 2014-10-25 (×2): 5 mL
  Administered 2014-10-25: 3 mL

## 2014-10-25 MED ORDER — DIPHENHYDRAMINE HCL 50 MG/ML IJ SOLN: 12.5000 mg | INTRAMUSCULAR | Status: DC | PRN

## 2014-10-25 NOTE — Anesthesia Preprocedure Evaluation (Signed)
Anesthesia Evaluation  Patient identified by MRN, date of birth, ID band Patient awake    Reviewed: Allergy & Precautions, H&P , NPO status , Patient's Chart, lab work & pertinent test results  History of Anesthesia Complications Negative for: history of anesthetic complications  Airway Mallampati: II  TM Distance: >3 FB Neck ROM: full    Dental no notable dental hx. (+) Teeth Intact   Pulmonary neg pulmonary ROS, asthma ,  breath sounds clear to auscultation  Pulmonary exam normal       Cardiovascular negative cardio ROS  Rhythm:regular Rate:Normal     Neuro/Psych Seizures -,  negative neurological ROS  negative psych ROS   GI/Hepatic negative GI ROS, Neg liver ROS,   Endo/Other  negative endocrine ROS  Renal/GU negative Renal ROS  negative genitourinary   Musculoskeletal   Abdominal Normal abdominal exam  (+)   Peds  Hematology negative hematology ROS (+)   Anesthesia Other Findings   Reproductive/Obstetrics (+) Pregnancy                             Anesthesia Physical Anesthesia Plan  ASA: II  Anesthesia Plan: Epidural   Post-op Pain Management:    Induction:   Airway Management Planned:   Additional Equipment:   Intra-op Plan:   Post-operative Plan:   Informed Consent: I have reviewed the patients History and Physical, chart, labs and discussed the procedure including the risks, benefits and alternatives for the proposed anesthesia with the patient or authorized representative who has indicated his/her understanding and acceptance.     Plan Discussed with:   Anesthesia Plan Comments:         Anesthesia Quick Evaluation

## 2014-10-25 NOTE — H&P (Signed)
24 y.o. [id number] @ [redacted]w[redacted]d presents from office for IOL for IUGR w elevated umbilical artery dopplers.  She was diagnosed w IUGR on March 23 with an EFW 734 gm (2%).  Umbilical artery dopplers were noted to be elevated today.  Given GA>37 wks, MFM recommended IOL. Otherwise has good fetal movement and no bleeding.   Pregnancy c/b: 1. Asthma: moderate persistent, on pulmicort 2. H/o seizure disorder: last in 2011.  Not on meds.  Has been non-compliant both prior to and during this pregnancy re: f/u w neurology.  3. H/o PTD: w G2.  IOL for IUGR.  4. Late PNC: first visit at 24 weeks. Dated by 24 wk [place].  5. H/o CT at initial visit.  Neg TOC/neg 3rd trimester testing  Past Medical History  Diagnosis Date  . Abnormal Pap smear     LAST PAP 09/2010  . Headache(784.0)     FREQUENT  . Asthma 2010  . Seizures 2009    WITH MIGRAINE  . Gonorrhea 07/2013    Past Surgical History  Procedure Laterality Date  . Laparoscopic appendectomy N/A 07/22/2013    Procedure: APPENDECTOMY LAPAROSCOPIC;  Surgeon: Wilmon Arms. Corliss Skains, MD;  Location: [facility] OR;  Service: General;  Laterality: N/A;  . Laparoscopy N/A 07/22/2013    Procedure: LAPAROSCOPY DIAGNOSTIC;  Surgeon: Wilmon Arms. Tsuei, MD;  Location: [facility] OR;  Service: General;  Laterality: N/A;    OB History  Gravida Para Term Preterm AB SAB TAB Ectopic Multiple Living  0 0 0 0 0 3    # Outcome Date GA Lbr Len/2nd Weight Sex Delivery Anes PTL Lv  4 Current           3 Term 12/16/12 [redacted]w[redacted]d 05:50 / 00:12 3.16 kg (6 lb 15.5 oz) M Vag-Spont EPI  Y  2 Preterm 06/25/10 [redacted]w[redacted]d 12:00 2.863 kg (6 lb 5 oz) F Vag-Spont EPI Y Y     Comments: IUGR; IOL  1 Term 08/18/07 [redacted]w[redacted]d 05:00 2.977 kg (6 lb 9 oz) M Vag-Spont EPI  Y     Comments: No complications      History   Social History  . Marital Status: Single    Spouse Name: N/A  . Number of Children: 2  . Years of Education: 13   Occupational History  . HOMEMAKER    Social History Main Topics  . Smoking status:  Never Smoker   . Smokeless tobacco: Never Used  . Alcohol Use: No     Comment: 07/22/2013 "birthday parties, etc; a few times/yr"LAST   TIME1-2015  . Drug Use: No  . Sexual Activity:    Partners: Male    Birth Control/ Protection: None   Other Topics Concern  . Not on file   Social History Narrative   Review of patient's allergies indicates no known allergies.    Prenatal Transfer Tool  Maternal Diabetes: No Genetic Screening: Normal Maternal Ultrasounds/Referrals: Abnormal:  Findings:   IUGR Fetal Ultrasounds or other Referrals:  None Maternal Substance Abuse:  No Significant Maternal Medications:  None Significant Maternal Lab Results: Lab values include: Group B Strep negative  ABO, Rh: --/--/O POS (04/13 1840) Antibody: NEG (04/13 1840) Rubella:  Immune RPR: Nonreactive (01/25 0000)  HBsAg:   Negative HIV: Non-reactive (01/25 0000)  GBS: Negative (03/30 0000)   BP 113/61 mmHg  Pulse 96  Temp(Src) 97.6 F (36.4 C) (Oral)  Resp 18  Ht  (1.651 m)  Wt 68.947 kg (152 lb)  BMI  25.29 kg/m2  LMP 04/28/2014  General:  NAD Lungs: CTAB Cardiac: RRR Abdomen:  soft, gravid, EFW 6# Ex:  no edema SVE:  3/50/-1 FHTs:  140, mod var, + accels, no decels Toco:  quiet   A/P   24 y.o. 813w1d  [id number] presents for IOL 2/2 IUGR and elevated UA dopplers Favorable cervix: start pitocin Epidural upon maternal request FSR/ vtx/ GBS neg  Mckinley Olheiser GEFFEL Javoni Lucken

## 2014-10-25 NOTE — Anesthesia Procedure Notes (Signed)
Epidural Patient location during procedure: OB  Staffing Anesthesiologist: Marshall Roehrich Performed by: anesthesiologist   Preanesthetic Checklist Completed: patient identified, site marked, surgical consent, pre-op evaluation, timeout performed, IV checked, risks and benefits discussed and monitors and equipment checked  Epidural Patient position: sitting Prep: DuraPrep Patient monitoring: heart rate, continuous pulse ox and blood pressure Approach: right paramedian Location: L3-L4 Injection technique: LOR saline  Needle:  Needle type: Tuohy  Needle gauge: 17 G Needle length: 9 cm and 9 Needle insertion depth: 5 cm Catheter type: closed end flexible Catheter size: 20 Guage Catheter at skin depth: 10 cm Test dose: negative  Assessment Events: blood not aspirated, injection not painful, no injection resistance, negative IV test and no paresthesia  Additional Notes   Patient tolerated the insertion well without complications.   

## 2014-10-26 ENCOUNTER — Encounter (HOSPITAL_COMMUNITY): Payer: Self-pay | Admitting: *Deleted

## 2014-10-26 LAB — RPR: RPR Ser Ql: NONREACTIVE

## 2014-10-26 MED ORDER — ONDANSETRON HCL 4 MG PO TABS
4.0000 mg | ORAL_TABLET | ORAL | Status: DC | PRN
Start: 2014-10-26 — End: 2014-10-28

## 2014-10-26 MED ORDER — WITCH HAZEL-GLYCERIN EX PADS: 1.0000 "application " | MEDICATED_PAD | CUTANEOUS | Status: DC | PRN

## 2014-10-26 MED ORDER — OXYCODONE-ACETAMINOPHEN 5-325 MG PO TABS: 1.0000 | ORAL_TABLET | ORAL | Status: DC | PRN

## 2014-10-26 MED ORDER — SIMETHICONE 80 MG PO CHEW
80.0000 mg | CHEWABLE_TABLET | ORAL | Status: DC | PRN
Start: 2014-10-26 — End: 2014-10-28

## 2014-10-26 MED ORDER — DIPHENHYDRAMINE HCL 25 MG PO CAPS: 25.0000 mg | ORAL_CAPSULE | Freq: Four times a day (QID) | ORAL | Status: DC | PRN

## 2014-10-26 MED ORDER — PRENATAL MULTIVITAMIN CH
1.0000 | ORAL_TABLET | Freq: Every day | ORAL | Status: DC
  Administered 2014-10-27 – 2014-10-28 (×2): 1 via ORAL
  Filled 2014-10-26 (×2): qty 1

## 2014-10-26 MED ORDER — SENNOSIDES-DOCUSATE SODIUM 8.6-50 MG PO TABS
2.0000 | ORAL_TABLET | ORAL | Status: DC
Start: 2014-10-27 — End: 2014-10-28
  Administered 2014-10-27 (×2): 2 via ORAL
  Filled 2014-10-26 (×2): qty 2

## 2014-10-26 MED ORDER — ONDANSETRON HCL 4 MG/2ML IJ SOLN
4.0000 mg | INTRAMUSCULAR | Status: DC | PRN
Start: 2014-10-26 — End: 2014-10-28
  Administered 2014-10-26: 4 mg via INTRAVENOUS
  Filled 2014-10-26: qty 2

## 2014-10-26 MED ORDER — ACETAMINOPHEN 325 MG PO TABS
650.0000 mg | ORAL_TABLET | ORAL | Status: DC | PRN
Start: 2014-10-26 — End: 2014-10-28

## 2014-10-26 MED ORDER — LANOLIN HYDROUS EX OINT: TOPICAL_OINTMENT | CUTANEOUS | Status: DC | PRN

## 2014-10-26 MED ORDER — IBUPROFEN 600 MG PO TABS
600.0000 mg | ORAL_TABLET | Freq: Four times a day (QID) | ORAL | Status: DC
  Administered 2014-10-26 – 2014-10-28 (×8): 600 mg via ORAL
  Filled 2014-10-26 (×9): qty 1

## 2014-10-26 MED ORDER — TETANUS-DIPHTH-ACELL PERTUSSIS 5-2.5-18.5 LF-MCG/0.5 IM SUSP: 0.5000 mL | Freq: Once | INTRAMUSCULAR | Status: DC

## 2014-10-26 MED ORDER — OXYCODONE-ACETAMINOPHEN 5-325 MG PO TABS: 2.0000 | ORAL_TABLET | ORAL | Status: DC | PRN

## 2014-10-26 MED ORDER — DIBUCAINE 1 % RE OINT: 1.0000 "application " | TOPICAL_OINTMENT | RECTAL | Status: DC | PRN

## 2014-10-26 MED ORDER — BENZOCAINE-MENTHOL 20-0.5 % EX AERO: 1.0000 "application " | INHALATION_SPRAY | CUTANEOUS | Status: DC | PRN

## 2014-10-26 NOTE — Progress Notes (Signed)
Comfortable w epidural  EFM: 130s, mod var, + accels, Cat 1 Toco q2-3 min SVE: 5/50/-1, AROM clear fluid  Cont pitocin Anticipate SVD FSR

## 2014-10-26 NOTE — Lactation Note (Signed)
This note was copied from the chart of Laurie [name]. Lactation Consultation Note  Patient Name: Laurie [name] WUJWJ'XToday's Date: 10/26/2014 Reason for consult: Initial assessment  Baby is 10 hours old and resting in the crib at moms bedside at start of consult with eyes wide open. LC offered to assist with latch - LC reviewed basics - breast massage , hand express, and with 2ml EBM yield. LC showed mom how to spoon feed and baby tolerated well. 2 ml . Baby rooting increased after appetizer of EBM. LC assisted mom with latching on the left breast with cross cradle position, obtained depth , and was able to sustain  A consistent pattern with multiply swallows, increased with breast compressions. Nipple appeared normal shape when  Baby released and per mom comfortable with latch. Due to baby being <6 pounds, and an  Early near  term infant.  37 2/7 weeks, LC set up a DEBP pump and instructed mom on how to use it and cleaning parts.  Per mom with her other baby's had WIC , but with this baby didn't sign up . Also has [employer] insurance , and pregnancy medicaid to  Off set her insurance coverage. LC recommended to call her insurance company and check for coverage for DEBP , and in the  Event they don't provide one under her coverage to call WIC to get an apt. Phone # given. Discussed with mom the need for a DEBP  After Discharge due to weight and gestational age.  Baby 5-7 oz at birth , has latched once in her life after birth for 20 mins @ 0457 And 2 attempts since. 2 voids, 2 stools, LS 5-8. @ this feeding baby breast fed well for weight and age. LC pointed out swallows. Mom seemed excited baby fed so well. LC discussed potential feedings behaviors of a 37 2/7 week infant < 6 pounds. Per mom has had small babies before.  Mother informed of post-discharge support and given phone number to the lactation department, including services for phone call assistance;  out-patient appointments; and  breastfeeding support group. List of other breastfeeding resources in the community given in the handout. Encouraged  mother to call for problems or concerns related to breastfeeding.   Maternal Data Has patient been taught Hand Expression?: Yes Does the patient have breastfeeding experience prior to this delivery?: Yes  Feeding Feeding Type: Breast Fed Length of feed: 14 min (several swallows noted )  LATCH Score/Interventions Latch: Grasps breast easily, tongue down, lips flanged, rhythmical sucking. Intervention(s): Skin to skin;Teach feeding cues;Waking techniques  Audible Swallowing: Spontaneous and intermittent  Type of Nipple: Everted at rest and after stimulation  Comfort (Breast/Nipple): Soft / non-tender     Hold (Positioning): Assistance needed to correctly position infant at breast and maintain latch. Intervention(s): Breastfeeding basics reviewed;Support Pillows;Position options;Skin to skin  LATCH Score: 9  Lactation Tools Discussed/Used Tools: Pump Breast pump type: Double-Electric Breast Pump WIC Program: No (per mom has been active in the past , but did not sign up this pregnancy , see LC note ) Pump Review: Setup, frequency, and cleaning Initiated by:: MAI  Date initiated:: 10/26/14   Consult Status Consult Status: Follow-up Date: 10/27/14 Follow-up type: In-patient    Laurie Greathouseorio, Laurie [name] 10/26/2014, 3:03 PM

## 2014-10-26 NOTE — Progress Notes (Signed)
Patient felt lightheaded while in bathroom on stedy. Brought back to bed, IV fluid bolus started, some juice given. Patient's feet elevated. Fundal exam performed.

## 2014-10-26 NOTE — Progress Notes (Signed)
Pt vomited large amount of food, states she feels sick  to her stomach, c/o headache on & off, no pain, vital signs stable, fundus firm at umbilicus, lochia small. Dr. Malen GauzeFoster, anesthesia called, no knew orders at this time. Dr. Dareen PianoAnderson notified via phone, message left. Pt was given Zofran IV at 1120, Assisted OOB using stedy, pt tolerated well, no dizziness, no nausea, and voided large amts. Returned back to bed. At 1200, pt states feels better currently. Ria CommentJNadeau, RN

## 2014-10-26 NOTE — Anesthesia Postprocedure Evaluation (Signed)
  Anesthesia Post-op Note  Patient: Laurie [name], Laurie [name]  Procedure(s) Performed: * No procedures listed *  Patient Location: PACU and Mother/Baby  Anesthesia Type:Epidural  Level of Consciousness: awake, alert  and oriented  Airway and Oxygen Therapy: Patient Spontanous Breathing  Post-op Pain: mild  Post-op Assessment: Post-op Vital signs reviewed, Patient's Cardiovascular Status Stable, Respiratory Function Stable, No signs of Nausea or vomiting, Adequate PO intake, Pain level controlled, No headache, No backache, No residual numbness and No residual motor weakness  Post-op Vital Signs: Reviewed and stable  Last Vitals:  Filed Vitals:   10/26/14 0840  BP: 103/58  Pulse: 78  Temp: 36.3 C  Resp: 18    Complications: No apparent anesthesia complications

## 2014-10-26 NOTE — Progress Notes (Signed)
Pt c/o stomach upset, vomited at 0845, pt also c/o headache and feeling weak. Fundus firm at umbilicus, bladder not palpable at this time. Pt encouraged to order breakfast. Vital signs stable. Ria CommentJNadeau, RN

## 2014-10-26 NOTE — Plan of Care (Signed)
Problem: Discharge Progression Outcomes Goal: Voids prior to transfer Outcome: Not Met (add Reason) Patient light headed in bathroom. Bladder scan performed, only 183m urine.

## 2014-10-27 LAB — CBC
HCT: 29.1 % — ABNORMAL LOW (ref 36.0–46.0)
Hemoglobin: 9.3 g/dL — ABNORMAL LOW (ref 12.0–15.0)
MCH: 28.2 pg (ref 26.0–34.0)
MCHC: 32 g/dL (ref 30.0–36.0)
MCV: 88.2 fL (ref 78.0–100.0)
PLATELETS: 173 10*3/uL (ref 150–400)
RBC: 3.3 MIL/uL — AB (ref 3.87–5.11)
RDW: 13.9 % (ref 11.5–15.5)
WBC: 11 10*3/uL — ABNORMAL HIGH (ref 4.0–10.5)

## 2014-10-27 NOTE — Lactation Note (Addendum)
This note was copied from the chart of Laurie Keyly [name]. Lactation Consultation Note  Paulino Rilyshley RN stated baby was sleepy at last feeding (only bf 2 min). Mother supplemented 3 hours later with 10 ml of colostrum in bottle. No longer spoon feeding. LC asked mother if she feels comfortable with feeding plan and she stated "yes".  Reminded mother to feed baby every 3 hours. Reviewed putting baby to the breast first and then giving breastmilk supplement after each breastfeeding. Mother was able to pump 15 ml.  Reviewed milk storage with mother. Denies questions or problems at this time.    Patient Name: Laurie [name] ZOXWR'UToday's Date: 10/27/2014     Maternal Data    Feeding Feeding Type: Breast Milk  LATCH Score/Interventions                      Lactation Tools Discussed/Used     Consult Status      Laurie [name], Laurie [name] 10/27/2014, 11:18 PM

## 2014-10-27 NOTE — Progress Notes (Signed)
Post Partum Day 1 Subjective: no complaints, up ad lib, voiding, tolerating PO and + flatus  Objective: Blood pressure 96/53, pulse 79, temperature 98.5 F (36.9 C), temperature source Oral, resp. rate 18, height 5\' 5"  (1.651 m), weight 68.947 kg (152 lb), last menstrual period 04/28/2014, SpO2 100 %, unknown if currently breastfeeding.  Physical Exam:  General: alert, cooperative and appears stated age Lochia: appropriate Uterine Fundus: firm   Recent Labs  10/25/14 1840 10/27/14 0540  HGB 10.5* 9.3*  HCT 32.1* 29.1*    Assessment/Plan: Plan for discharge tomorrow and Breastfeeding  Some concern for social support. Social work consult pending   LOS: 2 days   Laurie [name] H. 10/27/2014, 10:20 AM

## 2014-10-28 MED ORDER — DOCUSATE SODIUM 100 MG PO CAPS: 100.0000 mg | ORAL_CAPSULE | Freq: Two times a day (BID) | ORAL | Status: DC

## 2014-10-28 MED ORDER — IBUPROFEN 600 MG PO TABS: 600.0000 mg | ORAL_TABLET | Freq: Four times a day (QID) | ORAL | Status: DC | PRN

## 2014-10-28 MED ORDER — OXYCODONE-ACETAMINOPHEN 5-325 MG PO TABS: 2.0000 | ORAL_TABLET | ORAL | Status: DC | PRN

## 2014-10-28 NOTE — Progress Notes (Signed)
Clinical Social Work Department PSYCHOSOCIAL ASSESSMENT - MATERNAL/CHILD 10/28/2014  Patient:  Laurie [name],Laurie [name]  Account Number:  [id number]  Admit Date:  10/25/2014  Marjo Bickerhilds Name:   Laurie [name]    Clinical Social Worker:  Laurie Monjaras, LCSW   Date/Time:  10/28/2014 11:30 AM  Date Referred:  10/27/2014   Referral source  Central Nursery     Other referral source:    I:  FAMILY / HOME ENVIRONMENT Child's legal guardian:  PARENT  Guardian - Name Guardian - Age Guardian - Address  Laurie [name], [address] 24 [address]  [place], [place] [address]   Other household support members/support persons Other support:    II  PSYCHOSOCIAL DATA Information Source:    Event organiserinancial and Community Resources Employment:   Mother is employed   Surveyor, quantityinancial resources:  Media plannerrivate Insurance If [employer]icaid - [place]:   Clinical biochemistther  Food Stamps  WIC   School / Grade:   Maternity Care Coordinator / Child Services Coordination / Early Interventions:  Cultural issues impacting care:    III  STRENGTHS Strengths  Supportive family/friends  Home prepared for Child (including basic supplies)  Adequate Resources   Strength comment:    IV  RISK FACTORS AND CURRENT PROBLEMS Current Problem:       V  SOCIAL WORK ASSESSMENT Acknowledged order for social work consult to assess whether mother has custody of her other children.  Mother was very pleasant and receptive to CSW.  She is now a single parent with 3 dependents ages 755, 264, and 1.  Informed that she and her ex-husband share custody of the two oldest children, but they spend more time with him because he lives closer to their school.  FOB is uninvolved and unsupportive.  Informed that she had late prenatal care because she did not become aware of the pregnancy until Jan.   She lives alone and reports that ex-husband has been supportive.  She lives alone and states that she has a friend that will be staying with her for a few days. [place] of her family  [place] in [place].  She was initially quiet, but then became very engaged in the conversation.  She denies hx of SA or mental illness.  No acute social concerns related at this time.   Mother informed of CSW availability.      VI SOCIAL WORK PLAN Social Work Plan  No Further Intervention Required / No Barriers to Discharge   Type of pt/family education:   If child protective services report - county:   If child protective services report - date:   Information/referral to community resources comment:   Other social work plan:

## 2014-10-28 NOTE — Discharge Summary (Signed)
Obstetric Discharge Summary Reason for Admission: induction of labor Prenatal Procedures: NST and ultrasound Intrapartum Procedures: spontaneous vaginal delivery Postpartum Procedures: none Complications-Operative and Postpartum: none HEMOGLOBIN  Date Value Ref Range Status  10/27/2014 9.3* 12.0 - 15.0 g/dL Final   HCT  Date Value Ref Range Status  10/27/2014 29.1* 36.0 - 46.0 % Final    Physical Exam:  General: alert, cooperative and appears stated age 75Lochia: appropriate Uterine Fundus: firm  Discharge Diagnoses: Term Pregnancy-delivered and IUGR  Discharge Information: Date: 10/28/2014 Activity: pelvic rest Diet: routine Medications: Ibuprofen, Colace and Percocet Condition: improved Instructions: refer to practice specific booklet Discharge to: home Follow-up Information    [name] Laur[facility] Spore[facility] In 4 weeks.   Specialty:  Obstetrics   Why:  For a postpartum evaluation   Contact information:   [address] [place] [address] [phone]       Newborn Data: Live born female  Birth Weight: 5 lb 7 oz (2466 g) APGAR: 9, 9  Home with mother.  Laurie [name] H. 10/28/2014, 10:29 AM

## 2014-10-28 NOTE — Lactation Note (Signed)
This note was copied from the chart of Boy Reigna Silavong. Lactation Consultation Note Mom is mainly pumping and bottle feeding. Mom is over pumping against advise just to pump every 2-3 hours for 15-20 min. Mom is sometimes pumping every hour, explained this will cause over production and nipple trauma. Her breast to rest. Mom has a good supply of colostrum, more than the baby can handle at this time. RN advised her not to do this as well and reported this to me. Encouraged ICE to apply on breast and rest. Mom cont. To pump and massage. Mom feels that this will make her milk come in faster. Explained it will help, as well as resting w/baby STS.  Mom concerned d/t low weight. Demonstrated that she is producing more than the baby can handle and if she if feeding the baby when cueing or at least every 2-3 hours, the baby should be ok. Stressed importance of rest and low stress and relaxing when holding baby and feeding baby.  Patient Name: Boy Debby FreibergShantana Silavong [id number]'QToday's Date: 10/28/2014 Reason for consult: Follow-up assessment   Maternal Data    Feeding Feeding Type: Bottle Fed - Breast Milk  LATCH Score/Interventions       Type of Nipple: Everted at rest and after stimulation  Comfort (Breast/Nipple): Filling, red/small blisters or bruises, mild/mod discomfort  Problem noted: Filling  Intervention(s): Breastfeeding basics reviewed;Support Pillows;Position options;Skin to skin     Lactation Tools Discussed/Used Tools: Pump Breast pump type: Double-Electric Breast Pump   Consult Status Consult Status: PRN Date: 10/29/14 Follow-up type: In-patient    Charlene Cowdrey, Diamond NickelLAURA G 10/28/2014, 11:05 AM

## 2015-05-04 ENCOUNTER — Emergency Department (HOSPITAL_COMMUNITY): Payer: Medicaid Other

## 2015-05-04 ENCOUNTER — Encounter (HOSPITAL_COMMUNITY): Payer: Self-pay | Admitting: Emergency Medicine

## 2015-05-04 ENCOUNTER — Emergency Department (HOSPITAL_COMMUNITY)
Admission: EM | Admit: 2015-05-04 | Discharge: 2015-05-04 | Disposition: A | Discharge status: Home / Self Care | Payer: Medicaid Other | Attending: Emergency Medicine | Admitting: Emergency Medicine

## 2015-05-04 DIAGNOSIS — Z3A01 Less than 8 weeks gestation of pregnancy: Secondary | ICD-10-CM | POA: Insufficient documentation

## 2015-05-04 DIAGNOSIS — O23591 Infection of other part of genital tract in pregnancy, first trimester: Secondary | ICD-10-CM | POA: Diagnosis not present

## 2015-05-04 DIAGNOSIS — O99511 Diseases of the respiratory system complicating pregnancy, first trimester: Secondary | ICD-10-CM | POA: Insufficient documentation

## 2015-05-04 DIAGNOSIS — B9689 Other specified bacterial agents as the cause of diseases classified elsewhere: Secondary | ICD-10-CM

## 2015-05-04 DIAGNOSIS — Z8619 Personal history of other infectious and parasitic diseases: Secondary | ICD-10-CM | POA: Insufficient documentation

## 2015-05-04 DIAGNOSIS — O210 Mild hyperemesis gravidarum: Secondary | ICD-10-CM | POA: Insufficient documentation

## 2015-05-04 DIAGNOSIS — R102 Pelvic and perineal pain unspecified side: Secondary | ICD-10-CM

## 2015-05-04 DIAGNOSIS — O9989 Other specified diseases and conditions complicating pregnancy, childbirth and the puerperium: Secondary | ICD-10-CM | POA: Diagnosis not present

## 2015-05-04 DIAGNOSIS — M549 Dorsalgia, unspecified: Secondary | ICD-10-CM

## 2015-05-04 DIAGNOSIS — Z79899 Other long term (current) drug therapy: Secondary | ICD-10-CM | POA: Insufficient documentation

## 2015-05-04 DIAGNOSIS — R1030 Lower abdominal pain, unspecified: Secondary | ICD-10-CM

## 2015-05-04 DIAGNOSIS — Z3490 Encounter for supervision of normal pregnancy, unspecified, unspecified trimester: Secondary | ICD-10-CM

## 2015-05-04 DIAGNOSIS — N760 Acute vaginitis: Secondary | ICD-10-CM

## 2015-05-04 LAB — URINALYSIS, ROUTINE W REFLEX MICROSCOPIC
GLUCOSE, UA: NEGATIVE mg/dL
HGB URINE DIPSTICK: NEGATIVE
Ketones, ur: 15 mg/dL — AB
Nitrite: NEGATIVE
PH: 6 (ref 5.0–8.0)
Protein, ur: NEGATIVE mg/dL
SPECIFIC GRAVITY, URINE: 1.035 — AB (ref 1.005–1.030)
Urobilinogen, UA: 2 mg/dL — ABNORMAL HIGH (ref 0.0–1.0)

## 2015-05-04 LAB — URINE MICROSCOPIC-ADD ON

## 2015-05-04 LAB — CBC WITH DIFFERENTIAL/PLATELET
Basophils Absolute: 0 10*3/uL (ref 0.0–0.1)
Basophils Relative: 0 %
Eosinophils Absolute: 0.1 10*3/uL (ref 0.0–0.7)
Eosinophils Relative: 2 %
HEMATOCRIT: 33.2 % — AB (ref 36.0–46.0)
Hemoglobin: 11 g/dL — ABNORMAL LOW (ref 12.0–15.0)
LYMPHS PCT: 36 %
Lymphs Abs: 2.2 10*3/uL (ref 0.7–4.0)
MCH: 28.3 pg (ref 26.0–34.0)
MCHC: 33.1 g/dL (ref 30.0–36.0)
MCV: 85.3 fL (ref 78.0–100.0)
MONO ABS: 0.7 10*3/uL (ref 0.1–1.0)
Monocytes Relative: 12 %
NEUTROS ABS: 3 10*3/uL (ref 1.7–7.7)
NEUTROS PCT: 50 %
Platelets: 263 10*3/uL (ref 150–400)
RBC: 3.89 MIL/uL (ref 3.87–5.11)
RDW: 14.3 % (ref 11.5–15.5)
WBC: 6.1 10*3/uL (ref 4.0–10.5)

## 2015-05-04 LAB — I-STAT BETA HCG BLOOD, ED (MC, WL, AP ONLY): I-stat hCG, quantitative: >2000 m[IU]/mL — ABNORMAL HIGH (ref <5)

## 2015-05-04 LAB — HEPATIC FUNCTION PANEL
ALT: 12 U/L — ABNORMAL LOW (ref 14–54)
AST: 20 U/L (ref 15–41)
Albumin: 3.5 g/dL (ref 3.5–5.0)
Alkaline Phosphatase: 51 U/L (ref 38–126)
BILIRUBIN INDIRECT: 0.3 mg/dL (ref 0.3–0.9)
BILIRUBIN TOTAL: 0.4 mg/dL (ref 0.3–1.2)
Bilirubin, Direct: 0.1 mg/dL (ref 0.1–0.5)
Total Protein: 6.5 g/dL (ref 6.5–8.1)

## 2015-05-04 LAB — WET PREP, GENITAL
TRICH WET PREP: NONE SEEN
YEAST WET PREP: NONE SEEN

## 2015-05-04 LAB — GC/CHLAMYDIA PROBE AMP (~~LOC~~) NOT AT ARMC
Chlamydia: POSITIVE — AB
Neisseria Gonorrhea: NEGATIVE

## 2015-05-04 MED ORDER — METRONIDAZOLE 500 MG PO TABS: 500.0000 mg | ORAL_TABLET | Freq: Two times a day (BID) | ORAL | Status: DC

## 2015-05-04 MED ORDER — CEPHALEXIN 500 MG PO CAPS: 500.0000 mg | ORAL_CAPSULE | Freq: Three times a day (TID) | ORAL | Status: DC

## 2015-05-04 MED ORDER — PRENATAL COMPLETE 14-0.4 MG PO TABS: 1.0000 | ORAL_TABLET | Freq: Every day | ORAL | Status: DC

## 2015-05-04 NOTE — Discharge Instructions (Signed)
Please read and follow all provided instructions.  Your diagnoses today include:  1. Lower abdominal pain   2. Back pain   3. Pelvic pain in female   4. Intrauterine pregnancy   5. Bacterial vaginosis     Tests performed today include:  Blood counts and electrolytes  Blood tests to check liver and kidney function  Urine test to look for infection and pregnancy (in women)  Ultrasound - shows pregnancy at 6 weeks and 0 days, estimated due date 12/29/2015  Wet prep - shows bacterial vaginosis  Vital signs. See below for your results today.   Medications prescribed:   Metronidazole - antibiotic  You have been prescribed an antibiotic medicine: take the entire course of medicine even if you are feeling better. Stopping early can cause the antibiotic not to work. Do not drink alcohol when taking this medication.   Take any prescribed medications only as directed.  Home care instructions:   Follow any educational materials contained in this packet.  Follow-up instructions: Please follow-up with your primary care provider in the next 3 days for further evaluation of your symptoms.    Return instructions:  SEEK IMMEDIATE MEDICAL ATTENTION IF:  The pain does not go away or becomes severe   A temperature above 101F develops   Repeated vomiting occurs (multiple episodes)   The pain becomes localized to portions of the abdomen. The right side could possibly be appendicitis. In an adult, the left lower portion of the abdomen could be colitis or diverticulitis.   Blood is being passed in stools or vomit (bright red or black tarry stools)   You develop chest pain, difficulty breathing, dizziness or fainting, or become confused, poorly responsive, or inconsolable (young children)  If you have any other emergent concerns regarding your health  Additional Information: Abdominal (belly) pain can be caused by many things. Your caregiver performed an examination and possibly  ordered blood/urine tests and imaging (CT scan, x-rays, ultrasound). Many cases can be observed and treated at home after initial evaluation in the emergency department. Even though you are being discharged home, abdominal pain can be unpredictable. Therefore, you need a repeated exam if your pain does not resolve, returns, or worsens. Most patients with abdominal pain don't have to be admitted to the hospital or have surgery, but serious problems like appendicitis and gallbladder attacks can start out as nonspecific pain. Many abdominal conditions cannot be diagnosed in one visit, so follow-up evaluations are very important.  Your vital signs today were: BP 106/64 mmHg   Pulse 78   Temp(Src) 98 F (36.7 C) (Oral)   Resp 16   Ht 5\' 5"  (1.651 m)   Wt 130 lb (58.968 kg)   BMI 21.63 kg/m2   SpO2 98% If your blood pressure (bp) was elevated above 135/85 this visit, please have this repeated by your doctor within one month. --------------

## 2015-05-04 NOTE — ED Notes (Addendum)
Pt comfortable with discharge and follow up instructions. Pt declines wheelchair, escorted to waiting area by this RN. Prescriptions x4. 

## 2015-05-04 NOTE — ED Provider Notes (Signed)
CSN: [id number]     Arrival date & time 05/04/15  0533 History   First MD Initiated Contact with Patient 05/04/15 0602     Chief Complaint  Patient presents with  . Abdominal Pain     (Consider location/radiation/quality/duration/timing/severity/associated sxs/prior Treatment) HPI Comments: Patient with history of appendectomy presents with complaint of abdominal pain in the setting of a positive home pregnancy test. Patient states that she has had lower abdominal cramping with radiation to her lower back over the past 4 days. She has had occasional episodes of vomiting. No urinary symptoms or diarrhea. No fevers. Last menstrual period was early September 2016. She did note some pink spotting over the past day. No treatments prior to arrival. Onset of symptoms acute. Course is constant. Nothing makes symptoms better or worse.  The history is provided by the patient.    Past Medical History  Diagnosis Date  . Abnormal Pap smear     LAST PAP 09/2010  . Headache(784.0)     FREQUENT  . Asthma 2010  . Seizures (HCC) 2009    WITH MIGRAINE  . Gonorrhea 07/2013   Past Surgical History  Procedure Laterality Date  . Laparoscopic appendectomy N/A 07/22/2013    Procedure: APPENDECTOMY LAPAROSCOPIC;  Surgeon: Wilmon Arms. Corliss Skains, MD;  Location: [facility] OR;  Service: General;  Laterality: N/A;  . Laparoscopy N/A 07/22/2013    Procedure: LAPAROSCOPY DIAGNOSTIC;  Surgeon: Wilmon Arms. Corliss Skains, MD;  Location: [facility] OR;  Service: General;  Laterality: N/A;   Family History  Problem Relation Age of Onset  . Hypertension Father   . Asthma Father   . Asthma Brother   . Arthritis Paternal Aunt    Social History  Substance Use Topics  . Smoking status: Never Smoker   . Smokeless tobacco: Never Used  . Alcohol Use: No     Comment: 07/22/2013 "birthday parties, etc; a few times/yr"LAST   TIME1-2015   OB History    Gravida Para Term Preterm AB TAB SAB Ectopic Multiple Living   0 0 0 0 0 4     Review of  Systems  Constitutional: Negative for fever.  HENT: Negative for rhinorrhea and sore throat.   Eyes: Negative for redness.  Respiratory: Negative for cough.   Cardiovascular: Negative for chest pain.  Gastrointestinal: Positive for nausea, vomiting and abdominal pain. Negative for diarrhea and blood in stool.  Genitourinary: Positive for vaginal bleeding. Negative for dysuria, frequency, vaginal discharge and vaginal pain.  Musculoskeletal: Positive for back pain. Negative for myalgias.  Skin: Negative for rash.  Neurological: Negative for headaches.    Allergies  Review of patient's allergies indicates no known allergies.  Home Medications   Prior to Admission medications   Medication Sig Start Date End Date Taking? Authorizing Provider  albuterol (PROVENTIL HFA;VENTOLIN HFA) 108 (90 BASE) MCG/ACT inhaler Inhale 2 puffs into the lungs every 6 (six) hours as needed for wheezing. 10/15/13   Ronnald Nian, MD  docusate sodium (COLACE) 100 MG capsule Take 1 capsule (100 mg total) by mouth 2 (two) times daily. 10/28/14   Waynard Reeds, MD  ibuprofen (ADVIL,MOTRIN) 600 MG tablet Take 1 tablet (600 mg total) by mouth every 6 (six) hours as needed. 10/28/14   Waynard Reeds, MD  oxyCODONE-acetaminophen (ROXICET) 5-325 MG per tablet Take 2 tablets by mouth every 4 (four) hours as needed. May take 1-2 tablets every 4-6 hours as needed for pain 10/28/14   Waynard Reeds, MD  Prenatal Vit-Fe Fumarate-FA (  PRENATAL COMPLETE) 14-0.4 MG TABS Take 1 tablet by mouth daily. 07/30/14   Samuel Jester, DO   BP 102/57 mmHg  Pulse 72  Temp(Src) 98 F (36.7 C) (Oral)  Resp 16  Ht  (1.651 m)  Wt 130 lb (58.968 kg)  BMI 21.63 kg/m2  SpO2 100%   Physical Exam  Constitutional: She appears well-developed and well-nourished.  HENT:  Head: Normocephalic and atraumatic.  Eyes: Conjunctivae are normal. Right eye exhibits no discharge. Left eye exhibits no discharge.  Neck: Normal range of motion. Neck supple.   Cardiovascular: Normal rate, regular rhythm and normal heart sounds.   Pulmonary/Chest: Effort normal and breath sounds normal.  Abdominal: Soft. There is no tenderness.  Genitourinary: There is no rash or tenderness on the right labia. There is no rash or tenderness on the left labia. Uterus is not tender. Cervix exhibits no motion tenderness, no discharge (no bleeding) and no friability. Right adnexum displays no mass and no tenderness. Left adnexum displays no mass and no tenderness. Vaginal discharge (white) found.  Neurological: She is alert.  Skin: Skin is warm and dry.  Psychiatric: She has a normal mood and affect.  Nursing note and vitals reviewed.   ED Course  Procedures (including critical care time) Labs Review Labs Reviewed  WET PREP, GENITAL - Abnormal; Notable for the following:    Clue Cells Wet Prep HPF POC MANY (*)    WBC, Wet Prep HPF POC MANY (*)    All other components within normal limits  CBC WITH DIFFERENTIAL/PLATELET - Abnormal; Notable for the following:    Hemoglobin 11.0 (*)    HCT 33.2 (*)    All other components within normal limits  HEPATIC FUNCTION PANEL - Abnormal; Notable for the following:    ALT 12 (*)    All other components within normal limits  I-STAT BETA HCG BLOOD, ED ([facility], [facility], [facility] ONLY) - Abnormal; Notable for the following:    I-stat hCG, quantitative >2000.0 (*)    All other components within normal limits  URINALYSIS, ROUTINE W REFLEX MICROSCOPIC (NOT AT [facility])  GC/CHLAMYDIA PROBE AMP () NOT AT [facility]    Imaging Review US Ob Comp Less 14 Wks  05/04/2015  CLINICAL DATA:  Pelvic pain and cramping for 5 days. Positive pregnancy test. EXAM: OBSTETRIC <14 WK [place] AND TRANSVAGINAL OB US TECHNIQUE: Both transabdominal and transvaginal ultrasound examinations were performed for complete evaluation of the gestation as well as the maternal uterus, adnexal regions, and pelvic cul-de-sac. Transvaginal technique was performed to assess early  pregnancy. COMPARISON:  None. FINDINGS: Intrauterine gestational sac: Visualized/normal in shape. Yolk sac:  Present Embryo:  Present Cardiac Activity: Present Heart Rate: 113  bpm MSD:   mm    w     d CRL:  3.6  mm   6 w   0 d                  [place] EDC: 12/28/2015 Maternal uterus/adnexae: No subchorionic hemorrhage. Normal right ovary. Corpus luteum cyst left ovary. Small amount of free pelvic fluid. IMPRESSION: Single living intrauterine embryo estimated at 6 weeks and 0 days gestation. No subchorionic hemorrhage. Small amount of free pelvic fluid. Electronically Signed   By: Rudie Meyer M.D.   On: 05/04/2015 09:19   US Ob Transvaginal  05/04/2015  CLINICAL DATA:  Pelvic pain and cramping for 5 days. Positive pregnancy test. EXAM: OBSTETRIC <14 WK [place] AND TRANSVAGINAL OB US TECHNIQUE: Both transabdominal and transvaginal  ultrasound examinations were performed for complete evaluation of the gestation as well as the maternal uterus, adnexal regions, and pelvic cul-de-sac. Transvaginal technique was performed to assess early pregnancy. COMPARISON:  None. FINDINGS: Intrauterine gestational sac: Visualized/normal in shape. Yolk sac:  Present Embryo:  Present Cardiac Activity: Present Heart Rate: 113  bpm MSD:   mm    w     d CRL:  3.6  mm   6 w   0 d                  US EDC: 12/28/2015 Maternal uterus/adnexae: No subchorionic hemorrhage. Normal right ovary. Corpus luteum cyst left ovary. Small amount of free pelvic fluid. IMPRESSION: Single living intrauterine embryo estimated at 6 weeks and 0 days gestation. No subchorionic hemorrhage. Small amount of free pelvic fluid. Electronically Signed   By: Rudie MeyerP.  Gallerani M.D.   On: 05/04/2015 09:19   I have personally reviewed and evaluated these images and lab results as part of my medical decision-making.   EKG Interpretation None       6:09 AM Patient seen and examined. Work-up initiated. Pt declines pain medication. Will perform pelvic exam.   Vital signs  reviewed and are as follows: BP 102/57 mmHg  Pulse 72  Temp(Src) 98 F (36.7 C) (Oral)  Resp 16  Ht 5\' 5"  (1.651 m)  Wt 130 lb (58.968 kg)  BMI 21.63 kg/m2  SpO2 100%  7:05 AM Pregnancy +. Pelvic exam performed with nurse chaperone.   9:42 AM US shows IUP without other problem. Patient informed. Patient is symptomatic from discharge so will treat.   Encouraged OB/GYN follow-up for routine prenatal care. Encourage patient to return to the emergency department with worsening pain, vomiting, lightheadedness or syncope or other concerns. Encouraged use of Tylenol for pain.   MDM   Final diagnoses:  Lower abdominal pain  Intrauterine pregnancy  Bacterial vaginosis   Lower abdominal pain and pregnancy: Confirmed IUP without apparent complication. Labs and exam are reassuring. Do not suspect any emergent intra-abdominal process. Urine culture sent. Patient encouraged to return as above.   Renne CriglerJoshua Ameria Sanjurjo, PA-C 05/05/15 1608  Laurence Spatesachel Morgan Little, MD 05/05/15 2147

## 2015-05-04 NOTE — ED Notes (Signed)
Pt reports abdominal pain for "a few days". Denies N/V/D and fever.  Pain is centered in the lower abdomen and moves around back.

## 2015-05-05 LAB — URINE CULTURE

## 2015-05-07 ENCOUNTER — Telehealth (HOSPITAL_BASED_OUTPATIENT_CLINIC_OR_DEPARTMENT_OTHER): Payer: Self-pay | Admitting: Emergency Medicine

## 2015-05-07 NOTE — Telephone Encounter (Signed)
Chart handoff to EDP for treatment plan, + chlamydia

## 2015-05-08 ENCOUNTER — Telehealth (HOSPITAL_COMMUNITY): Payer: Self-pay

## 2015-05-08 NOTE — Telephone Encounter (Signed)
Chart returned from EDP office. Reviewed by Dr Adriana Simasook. Orders given for zithromax 1 gram po x 1. Attempting to contact pt. [employer] form faxed

## 2015-05-09 ENCOUNTER — Telehealth (HOSPITAL_BASED_OUTPATIENT_CLINIC_OR_DEPARTMENT_OTHER): Payer: Self-pay | Admitting: Emergency Medicine

## 2015-05-09 NOTE — Telephone Encounter (Signed)
Post ED Visit - Positive Culture Follow-up: Successful Patient Follow-Up  Culture assessed and recommendations reviewed by: []  Celedonio MiyamotoJeremy Frens, Pharm.D., BCPS-AQ ID []  Georgina PillionElizabeth Martin, [address]Ph[address]D., BCPS []  [place] Acres[place]h Pham, [place] BCPS, AAHIVP []  Estella HuskMichelle Turner, Pharm.D., BCPS, AAHIVP []  Poseyvilleristy Reyes, [address] []  Cassie Roseanne RenoStewart, [place]Pharm.D.  Positive Chlamydia culture  [x]  Patient discharged without antimicrobial prescription and treatment is now indicated []  Organism is resistant to prescribed ED discharge antimicrobial []  Patient with positive blood cultures  Changes discussed with ED provider: Donnetta HutchingBrian Cook MD New antibiotic prescription Zithromax 1 gram po [facility] [place] [phone]  Contacted patient,05/09/15 1043   Berle MullMiller, Eldora Napp 05/09/2015, 10:42 AM

## 2015-08-14 IMAGING — US US OB TRANSVAGINAL
1 series · 14 of 26 positions shown · non-contrast
Comparison: None.

CLINICAL DATA: Pelvic pain, cramping and spotting, gestational age
by last menstrual period 10 weeks and 1 day.

EXAM:
OBSTETRIC <14 WK US AND TRANSVAGINAL OB US
TECHNIQUE: Both transabdominal and transvaginal ultrasound examinations were
performed for complete evaluation of the gestation as well as the
maternal uterus, adnexal regions, and pelvic cul-de-sac.
Transvaginal technique was performed to assess early pregnancy.

[Series 1: us ob transvaginal · 0.21mm/px · 26 acquisitions, 14 frames shown]
[im 1/26]
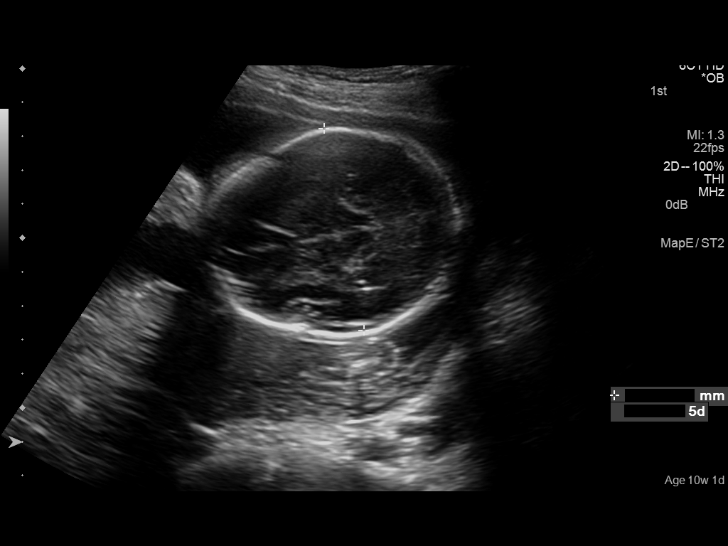
[im 3/26]
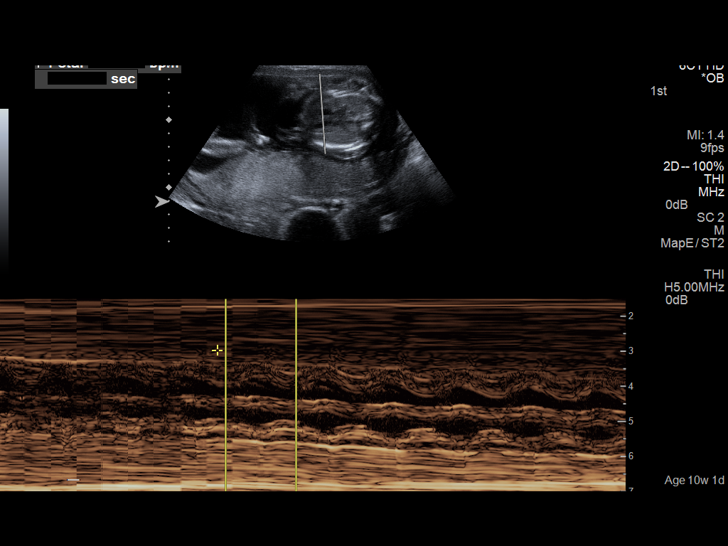
[im 5/26]
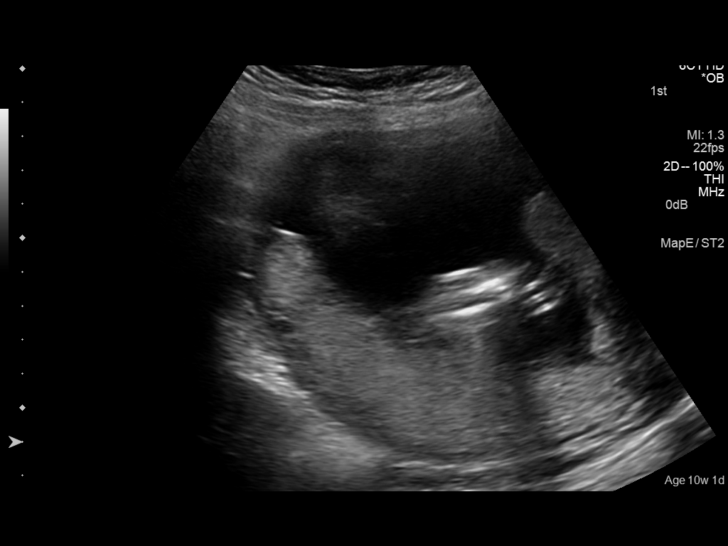
[im 7/26]
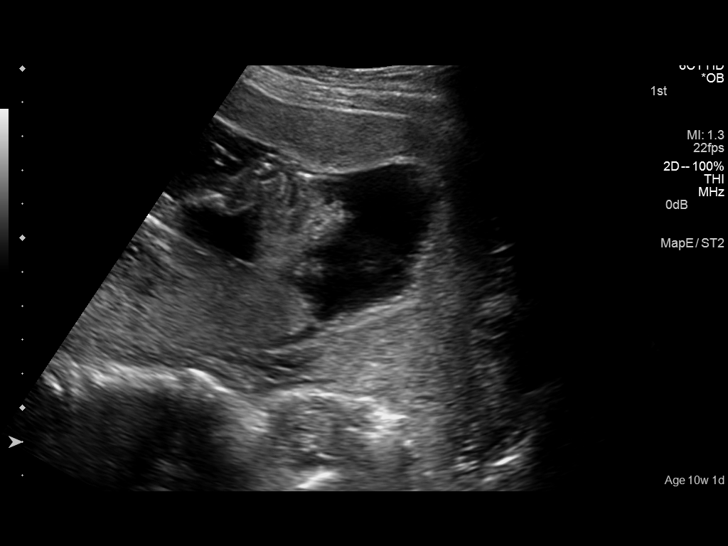
[im 9/26]
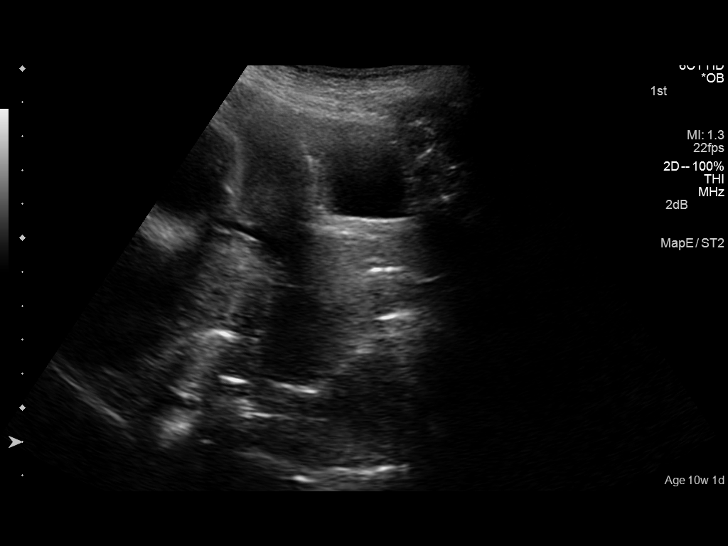
[im 11/26]
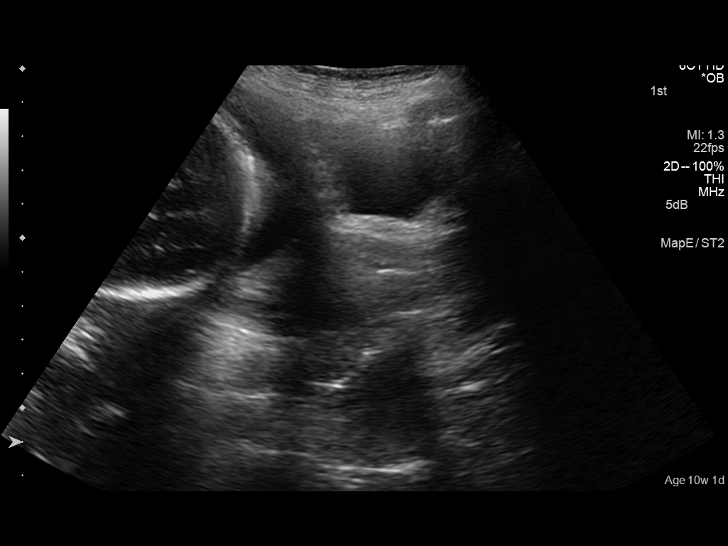
[im 13/26]
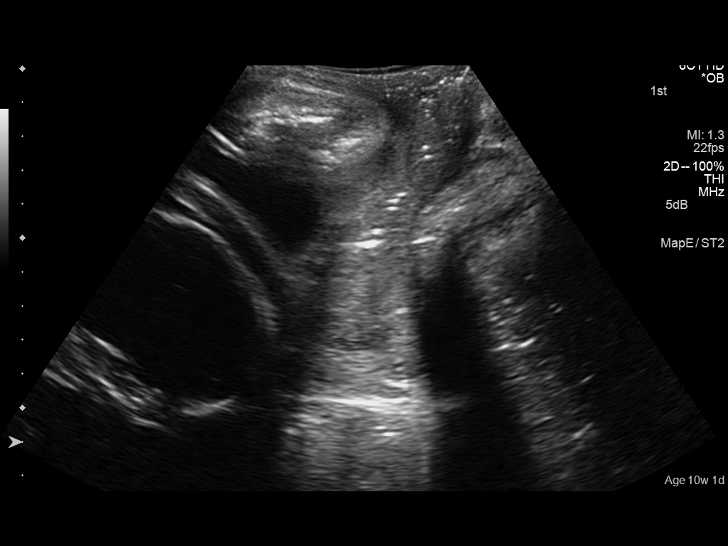
[im 14/26]
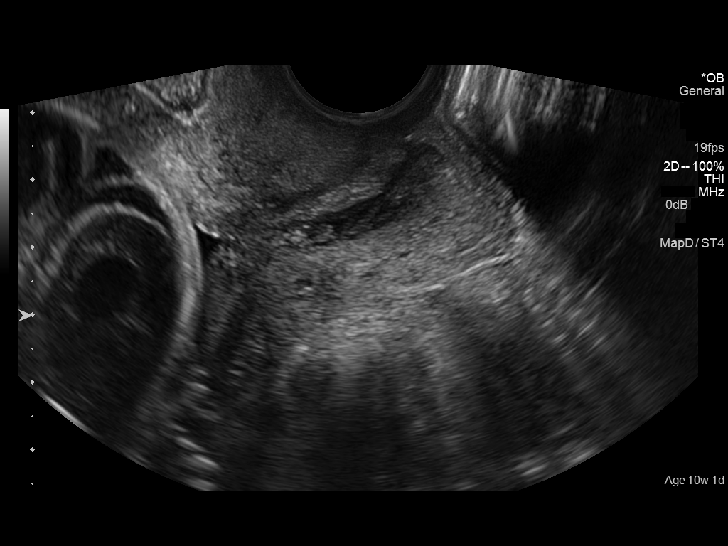
[im 16/26]
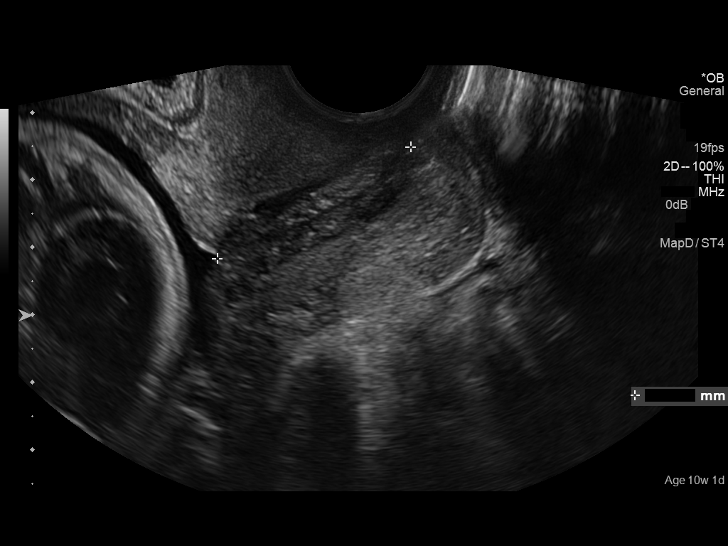
[im 18/26]
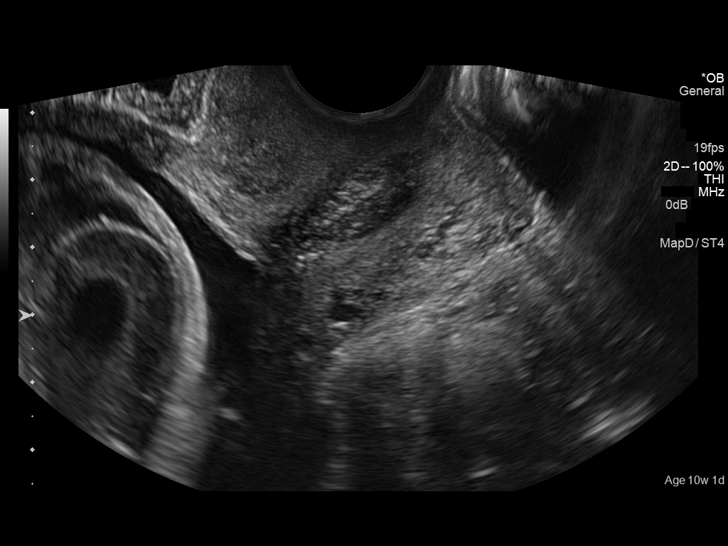
[im 20/26]
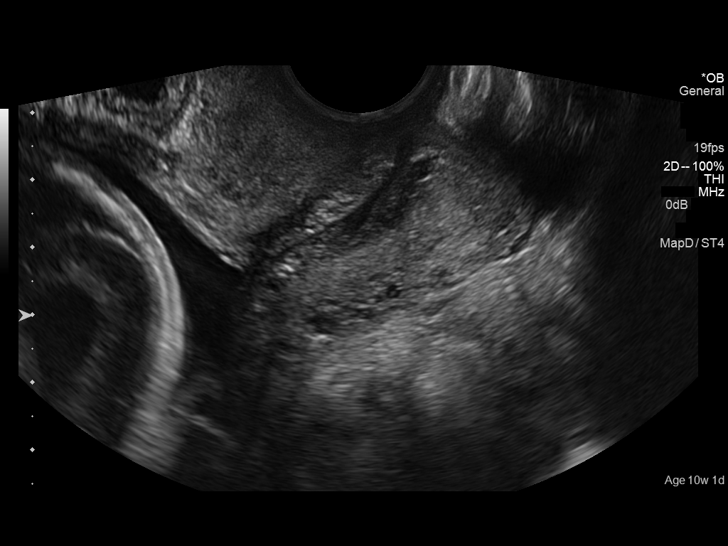
[im 22/26]
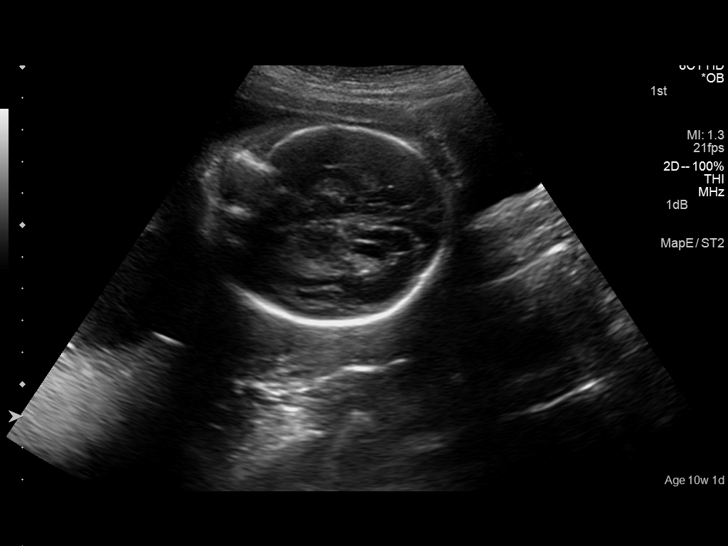
[im 24/26]
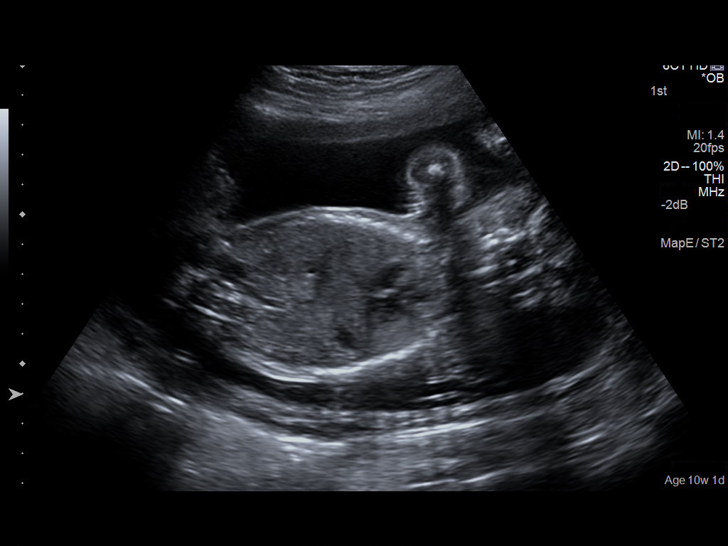
[im 26/26]
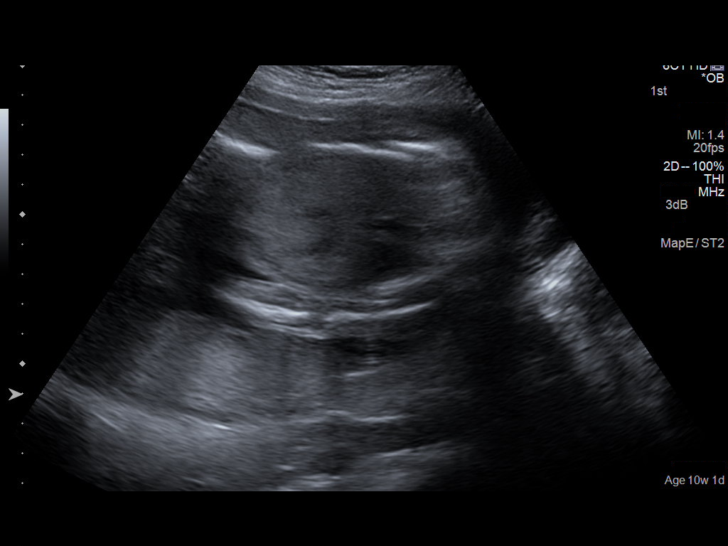

[14 of 26 positions shown; findings below may reference images not displayed]

FINDINGS: Number of Fetuses: 1

Heart Rate:  144 bpm

Movement: Yes

Presentation: Cephalic

Placental Location: Posterior

Previa: No

Amniotic Fluid (Subjective): Within normal limits.

BPD:  6.6cm 24w  5d ; EDD November 14, 2014

MATERNAL FINDINGS:

Cervix:  Appears closed.

Uterus:  No acute abnormality visualized.  Adnexal not evaluated.
IMPRESSION: Single live intrauterine pregnancy, gestational age by ultrasound 24
weeks and 5 days, EDD November 14, 2014 without immediate complications.

This exam is performed on an emergent basis and does not
comprehensively evaluate fetal size, dating, or anatomy; follow-up
complete OB US should be considered if further fetal assessment is
warranted.

  By: Kev Range

## 2016-03-11 IMAGING — US US OB TRANSVAGINAL
1 series · 14 of 28 positions shown · non-contrast
Comparison: None.

CLINICAL DATA: Pelvic pain and cramping for 5 days. Positive
pregnancy test.

EXAM:
OBSTETRIC <14 WK US AND TRANSVAGINAL OB US
TECHNIQUE: Both transabdominal and transvaginal ultrasound examinations were
performed for complete evaluation of the gestation as well as the
maternal uterus, adnexal regions, and pelvic cul-de-sac.
Transvaginal technique was performed to assess early pregnancy.

[Series 1: us ob transvaginal · 0.17mm/px · 14 of 103 slices shown]
[im 4/103]
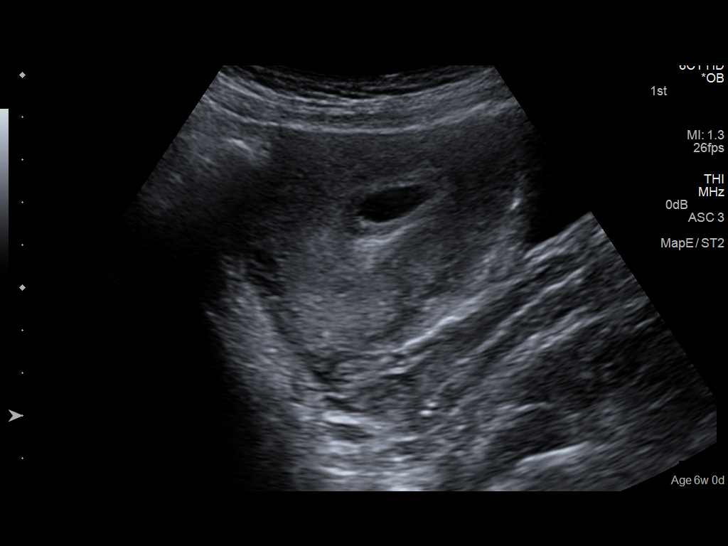
[im 12/103]
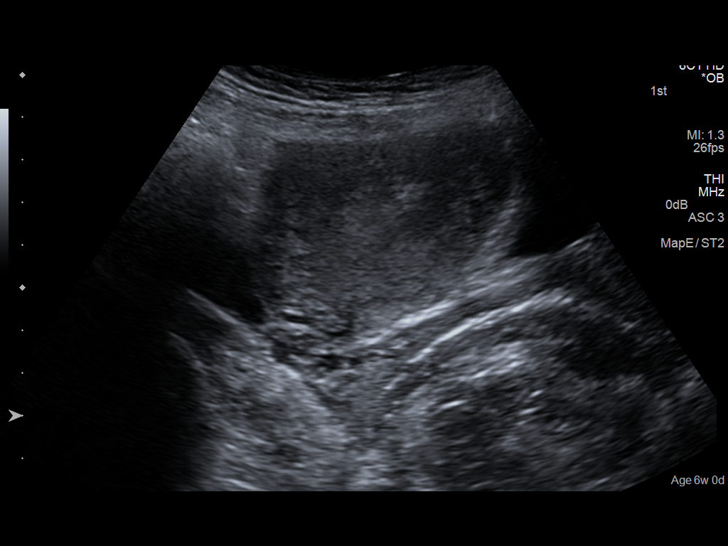
[im 19/103]
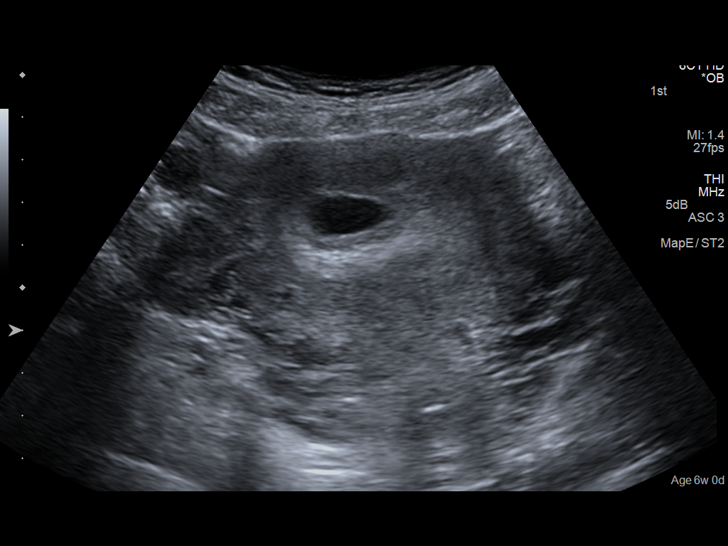
[im 27/103]
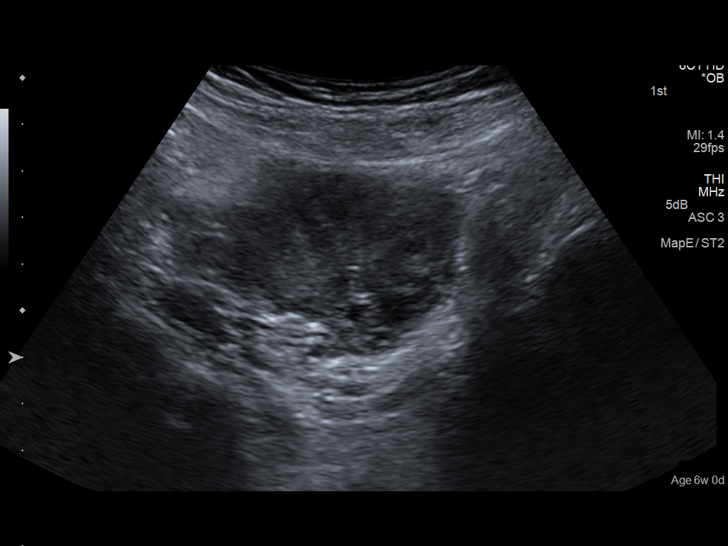
[im 35/103]
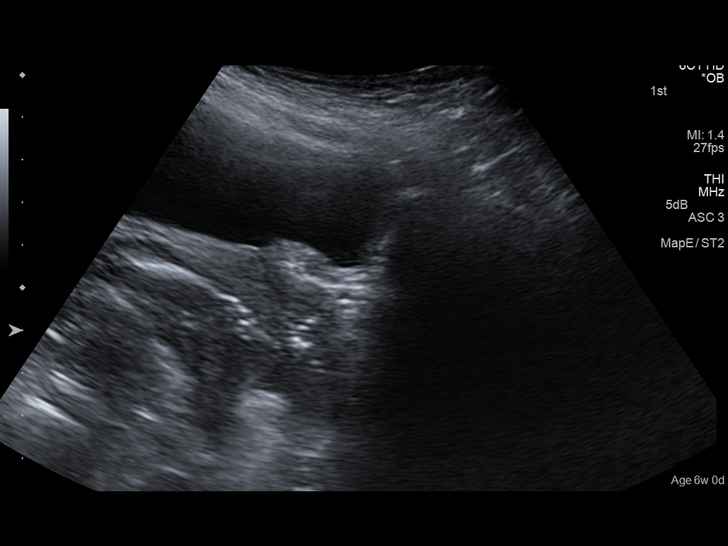
[im 42/103]
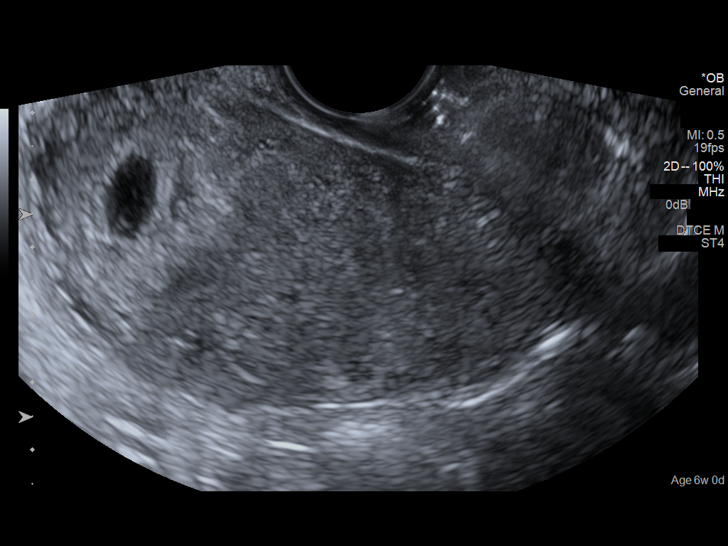
[im 50/103]
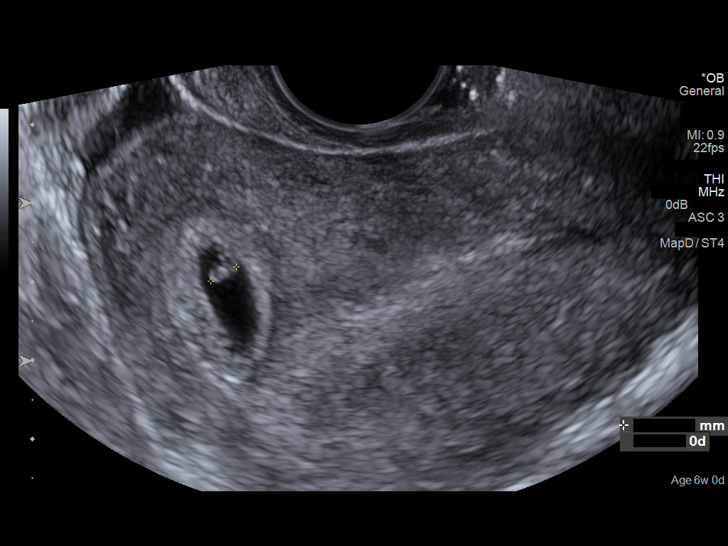
[im 57/103]
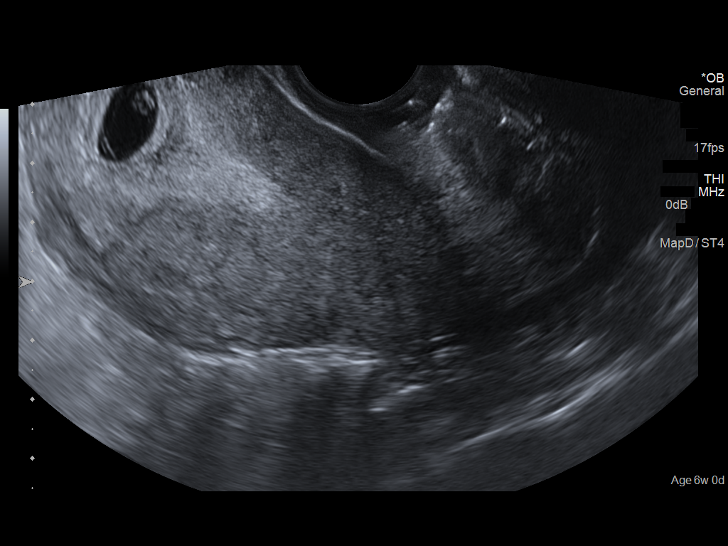
[im 65/103]
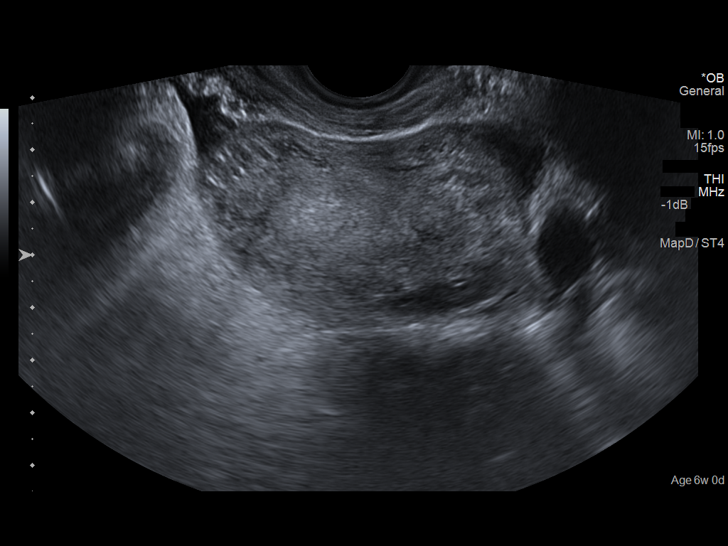
[im 72/103]
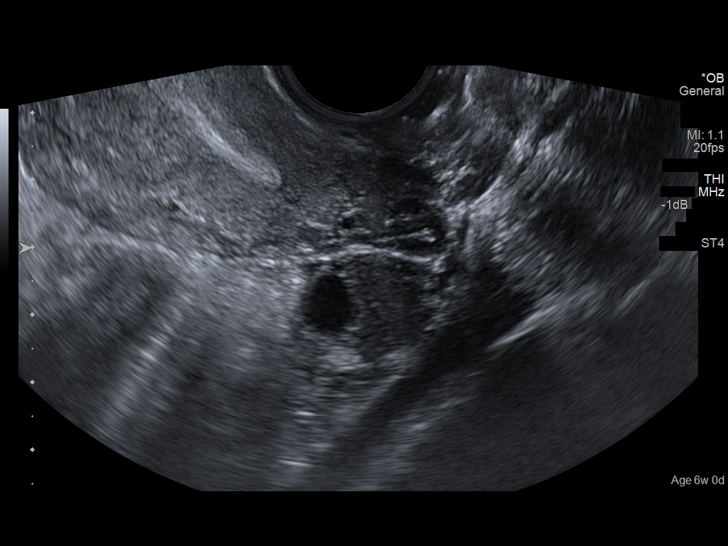
[im 80/103]
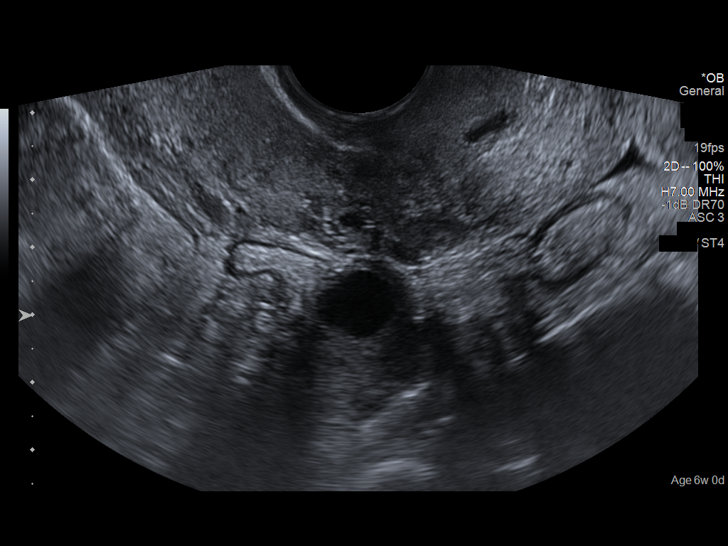
[im 87/103]
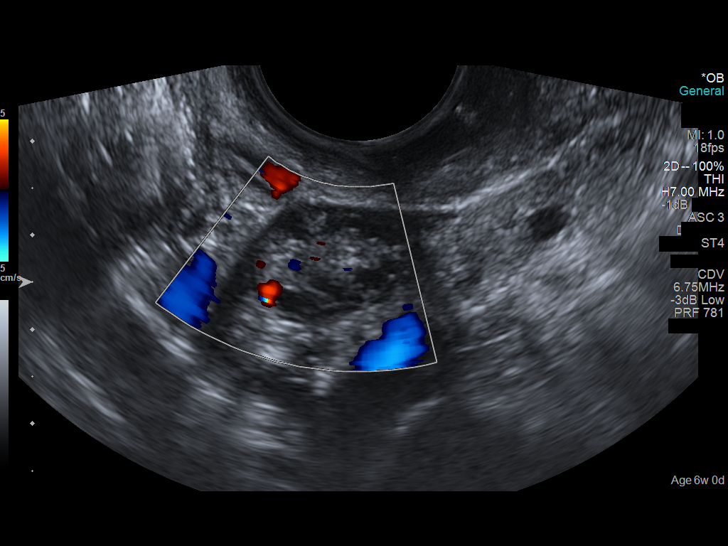
[im 95/103]
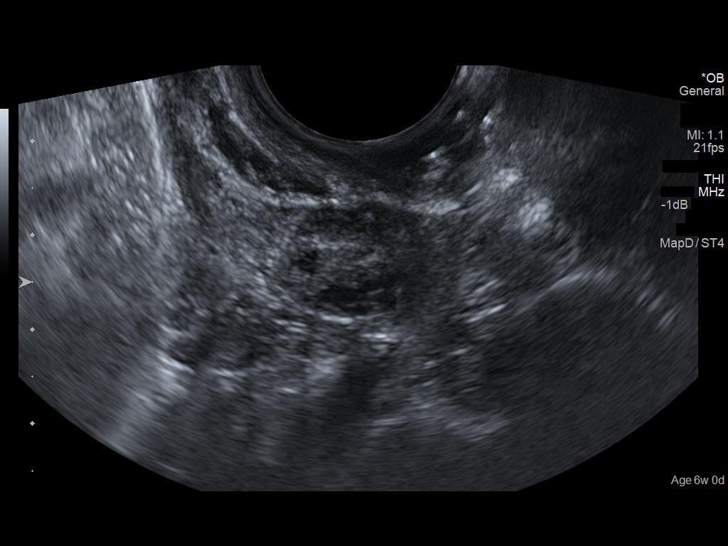
[im 103/103]
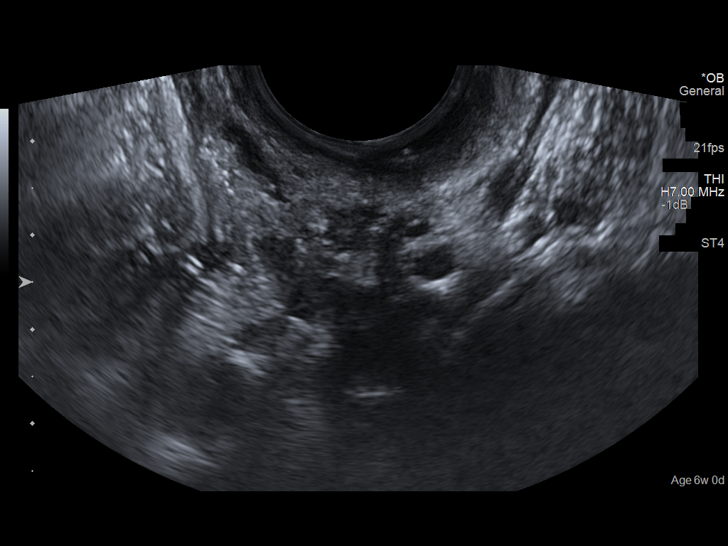

[14 of 28 positions shown; findings below may reference images not displayed]

FINDINGS: Intrauterine gestational sac: Visualized/normal in shape.

Yolk sac:  Present

Embryo:  Present

Cardiac Activity: Present

Heart Rate: 113  bpm

MSD:   mm    w     d

CRL:  3.6  mm   6 w   0 d                  US EDC: 12/28/2015

Maternal uterus/adnexae:

No subchorionic hemorrhage.

Normal right ovary.

Corpus luteum cyst left ovary.

Small amount of free pelvic fluid.
IMPRESSION: Single living intrauterine embryo estimated at 6 weeks and 0 days
gestation.

No subchorionic hemorrhage.

Small amount of free pelvic fluid.

## 2018-04-06 ENCOUNTER — Emergency Department (HOSPITAL_COMMUNITY): Admission: EM | Admit: 2018-04-06 | Discharge: 2018-04-06 | Discharge status: Against Medical Advice | Payer: 59

## 2018-04-06 NOTE — ED Notes (Signed)
Pt. Called x3 for triage.

## 2018-04-08 ENCOUNTER — Ambulatory Visit (HOSPITAL_COMMUNITY)
Admission: EM | Admit: 2018-04-08 | Discharge: 2018-04-08 | Disposition: A | Discharge status: Home / Self Care | Payer: Self-pay | Attending: Family Medicine | Admitting: Family Medicine

## 2018-04-08 ENCOUNTER — Encounter (HOSPITAL_COMMUNITY): Payer: Self-pay | Admitting: Emergency Medicine

## 2018-04-08 DIAGNOSIS — N390 Urinary tract infection, site not specified: Secondary | ICD-10-CM

## 2018-04-08 DIAGNOSIS — Z3202 Encounter for pregnancy test, result negative: Secondary | ICD-10-CM

## 2018-04-08 DIAGNOSIS — R3915 Urgency of urination: Secondary | ICD-10-CM

## 2018-04-08 DIAGNOSIS — M545 Low back pain: Secondary | ICD-10-CM

## 2018-04-08 LAB — POCT URINALYSIS DIP (DEVICE)
BILIRUBIN URINE: NEGATIVE
Glucose, UA: NEGATIVE mg/dL
Ketones, ur: NEGATIVE mg/dL
NITRITE: NEGATIVE
PH: 6 (ref 5.0–8.0)
PROTEIN: NEGATIVE mg/dL
Specific Gravity, Urine: 1.025 (ref 1.005–1.030)
UROBILINOGEN UA: 0.2 mg/dL (ref 0.0–1.0)

## 2018-04-08 LAB — POCT PREGNANCY, URINE: PREG TEST UR: NEGATIVE

## 2018-04-08 MED ORDER — NITROFURANTOIN MONOHYD MACRO 100 MG PO CAPS: 100.0000 mg | ORAL_CAPSULE | Freq: Two times a day (BID) | ORAL | 0 refills | Status: DC

## 2018-04-08 NOTE — ED Provider Notes (Signed)
[facility]    CSN: [id number] Arrival date & time: 04/08/18  1017     History   Chief Complaint Chief Complaint  Patient presents with  . Back Pain    HPI Laurie [name] is a 27 y.o. female.   Patient is a 27 year old female that presents for lower lumbar pain x3 days.  She woke up this morning with the urge to urinate.  Described it as a feeling of almost urinating on herself.  When she wiped she noticed some blood on the toilet paper.  She does had her menstrual cycle 2 weeks ago and it was normal.  She denies any dysuria but does have some slight vaginal discharge without odor.  She denies any abdominal pain, pelvic pain.  She is currently sexually active with one partner and uses condoms for protection.  She denies any concern for STDs.  ROS per HPI      Past Medical History:  Diagnosis Date  . Abnormal Pap smear    LAST PAP 09/2010  . Asthma 2010  . Gonorrhea 07/2013  . Headache(784.0)    FREQUENT  . Seizures (HCC) 2009   WITH MIGRAINE    Patient Active Problem List   Diagnosis Date Noted  . Pregnancy affected by fetal growth restriction 10/25/2014  . IUGR (intrauterine growth restriction) affecting care of mother   . [redacted] weeks gestation of pregnancy   . Gonorrhea 07/22/2013  . RLQ abdominal pain 07/21/2013  . SVD (spontaneous vaginal delivery) 12/16/2012  . H/O fetal biophysical profile 4/8 12/15/2012  . Asthma 07/16/2012    Past Surgical History:  Procedure Laterality Date  . LAPAROSCOPIC APPENDECTOMY N/A 07/22/2013   Procedure: APPENDECTOMY LAPAROSCOPIC;  Surgeon: Wilmon Arms. Corliss Skains, MD;  Location: [facility] OR;  Service: General;  Laterality: N/A;  . LAPAROSCOPY N/A 07/22/2013   Procedure: LAPAROSCOPY DIAGNOSTIC;  Surgeon: Wilmon Arms. Tsuei, MD;  Location: [facility] OR;  Service: General;  Laterality: N/A;    OB History    Gravida  4   Para  4   Term  3   Preterm  1   AB  0   Living  4     SAB  0   TAB  0   Ectopic  0   Multiple  0   Live Births  4            Home Medications    Prior to Admission medications   Medication Sig Start Date End Date Taking? Authorizing Provider  cephALEXin (KEFLEX) 500 MG capsule Take 1 capsule (500 mg total) by mouth 3 (three) times daily. 05/04/15   Renne Crigler, PA-C  metroNIDAZOLE (FLAGYL) 500 MG tablet Take 1 tablet (500 mg total) by mouth 2 (two) times daily. 05/04/15   Renne Crigler, PA-C  nitrofurantoin, macrocrystal-monohydrate, (MACROBID) 100 MG capsule Take 1 capsule (100 mg total) by mouth 2 (two) times daily. 04/08/18   Janace Aris, NP  Prenatal Vit-Fe Fumarate-FA (PRENATAL COMPLETE) 14-0.4 MG TABS Take 1 tablet by mouth daily. 05/04/15   Renne Crigler, PA-C    Family History Family History  Problem Relation Age of Onset  . Hypertension Father   . Asthma Father   . Asthma Brother   . Arthritis Paternal Aunt     Social History Social History   Tobacco Use  . Smoking status: Never Smoker  . Smokeless tobacco: Never Used  Substance Use Topics  . Alcohol use: No    Comment: 07/22/2013 "birthday parties, etc; a few times/yr"LAST  [id number]  . Drug use: No     Allergies   Patient has no known allergies.   Review of Systems Review of Systems   Physical Exam Triage Vital Signs ED Triage Vitals  Enc Vitals Group     BP 04/08/18 1104 (!) 109/56     Pulse Rate 04/08/18 1104 79     Resp 04/08/18 1104 16     Temp 04/08/18 1104 98 F (36.7 C)     Temp Source 04/08/18 1104 Oral     SpO2 04/08/18 1104 100 %     Weight 04/08/18 1105 127 lb (57.6 kg)     Height --      Head Circumference --      Peak Flow --      Pain Score 04/08/18 1105 7     Pain Loc --      Pain Edu? --      Excl. in GC? --    No data found.  Updated Vital Signs BP (!) 109/56   Pulse 79   Temp 98 F (36.7 C) (Oral)   Resp 16   Wt 127 lb (57.6 kg)   LMP 03/25/2018   SpO2 100%   BMI 21.13 kg/m   Visual Acuity Right Eye Distance:   Left Eye Distance:   Bilateral  Distance:    Right Eye Near:   Left Eye Near:    Bilateral Near:     Physical Exam  Constitutional: She is oriented to person, place, and time. She appears well-developed and well-nourished.  Very pleasant. Non toxic or ill appearing.     HENT:  Head: Normocephalic and atraumatic.  Eyes: Conjunctivae are normal.  Neck: Normal range of motion.  Pulmonary/Chest: Effort normal.  Abdominal: Soft. Bowel sounds are normal.  Abdomen soft, non tender. No CVA tenderness. No rebound tenderness.     Musculoskeletal: Normal range of motion.  Nontender to palpation of lower lumbar spine or paravertebral muscles.  No CVA tenderness  Neurological: She is alert and oriented to person, place, and time.  Skin: Skin is warm and dry.  Psychiatric: She has a normal mood and affect.  Nursing note and vitals reviewed.    UC Treatments / Results  Labs (all labs ordered are listed, but only abnormal results are displayed) Labs Reviewed  POCT URINALYSIS DIP (DEVICE) - Abnormal; Notable for the following components:      Result Value   Hgb urine dipstick SMALL (*)    Leukocytes, UA SMALL (*)    All other components within normal limits  POCT PREGNANCY, URINE    EKG None  Radiology No results found.  Procedures Procedures (including critical care time)  Medications Ordered in UC Medications - No data to display  Initial Impression / Assessment and Plan / UC Course  I have reviewed the triage vital signs and the nursing notes.  Pertinent labs & imaging results that were available during my care of the patient were reviewed by me and considered in my medical decision making (see chart for details).     Urine negative for pregnancy. Trace leuks and hemoglobin We will go ahead and treat for urinary tract infection with Macrobid Instructed to follow-up for continued or worsening symptoms. Final Clinical Impressions(s) / UC Diagnoses   Final diagnoses:  Lower urinary tract infectious  disease     Discharge Instructions     We will go ahead and treat you for urinary tract infection Make sure you are drinking plenty of water  to stay hydrated Follow-up for any continued or worsening symptoms.     ED Prescriptions    Medication Sig Dispense Auth. Provider   nitrofurantoin, macrocrystal-monohydrate, (MACROBID) 100 MG capsule Take 1 capsule (100 mg total) by mouth 2 (two) times daily. 10 capsule Dahlia Byes A, NP     Controlled Substance Prescriptions [place] Controlled Substance Registry consulted? Not Applicable   Janace Aris, NP 04/08/18 1154    Isa Rankin, MD 04/20/18 1530

## 2018-04-08 NOTE — ED Triage Notes (Signed)
PT reports lower back pain for 3 days. PT had her normal menstrual 2 weeks ago. PT had some vaginal bleeding this AM. No dysuria

## 2018-04-08 NOTE — Discharge Instructions (Signed)
We will go ahead and treat you for urinary tract infection Make sure you are drinking plenty of water to stay hydrated Follow-up for any continued or worsening symptoms.

## 2018-04-16 ENCOUNTER — Encounter (HOSPITAL_COMMUNITY): Payer: Self-pay

## 2018-04-16 ENCOUNTER — Ambulatory Visit (HOSPITAL_COMMUNITY)
Admission: EM | Admit: 2018-04-16 | Discharge: 2018-04-16 | Disposition: A | Discharge status: Home / Self Care | Payer: Self-pay | Attending: Family Medicine | Admitting: Family Medicine

## 2018-04-16 DIAGNOSIS — Z113 Encounter for screening for infections with a predominantly sexual mode of transmission: Secondary | ICD-10-CM

## 2018-04-16 DIAGNOSIS — Z8249 Family history of ischemic heart disease and other diseases of the circulatory system: Secondary | ICD-10-CM | POA: Insufficient documentation

## 2018-04-16 DIAGNOSIS — Z87440 Personal history of urinary (tract) infections: Secondary | ICD-10-CM | POA: Insufficient documentation

## 2018-04-16 DIAGNOSIS — B9789 Other viral agents as the cause of diseases classified elsewhere: Secondary | ICD-10-CM

## 2018-04-16 DIAGNOSIS — J069 Acute upper respiratory infection, unspecified: Secondary | ICD-10-CM

## 2018-04-16 DIAGNOSIS — N898 Other specified noninflammatory disorders of vagina: Secondary | ICD-10-CM

## 2018-04-16 DIAGNOSIS — Z825 Family history of asthma and other chronic lower respiratory diseases: Secondary | ICD-10-CM | POA: Insufficient documentation

## 2018-04-16 DIAGNOSIS — J029 Acute pharyngitis, unspecified: Secondary | ICD-10-CM

## 2018-04-16 DIAGNOSIS — R05 Cough: Secondary | ICD-10-CM

## 2018-04-16 LAB — POCT RAPID STREP A: Streptococcus, Group A Screen (Direct): NEGATIVE

## 2018-04-16 MED ORDER — FLUCONAZOLE 150 MG PO TABS: 150.0000 mg | ORAL_TABLET | Freq: Once | ORAL | 0 refills | Status: AC

## 2018-04-16 MED ORDER — CETIRIZINE HCL 10 MG PO CAPS: 10.0000 mg | ORAL_CAPSULE | Freq: Every day | ORAL | 0 refills | Status: DC

## 2018-04-16 NOTE — ED Triage Notes (Signed)
Pt presents with complaints of cough and scratchy throat x 3 days.

## 2018-04-16 NOTE — Discharge Instructions (Signed)
Sore Throat  Your rapid strep tested Negative today. We will send for a culture and call in about 2 days if results are positive. For now we will treat your sore throat as a virus with symptom management.   Please continue Tylenol or Ibuprofen for fever and pain. May try salt water gargles, cepacol lozenges, throat spray, or OTC cold relief medicine for throat discomfort. If you also have congestion take a daily anti-histamine like Zyrtec, Claritin, and a oral decongestant to help with post nasal drip that may be irritating your throat.   Stay hydrated and drink plenty of fluids to keep your throat coated relieve irritation.  Discharge Please take 1 tablet of Diflucan today, may repeat in 3 days if symptoms still persisting.  This medicine is to treat a yeast infection.  We are testing you for Gonorrhea, Chlamydia, Trichomonas, Yeast and Bacterial Vaginosis. We will call you if anything is positive and let you know if you require any further treatment. Please inform partners of any positive results.   Please return if symptoms not improving with treatment, development of fever, nausea, vomiting, abdominal pain.

## 2018-04-16 NOTE — ED Provider Notes (Signed)
[facility]    CSN: [id number] Arrival date & time: 04/16/18  1139     History   Chief Complaint Chief Complaint  Patient presents with  . Cough    HPI Laurie [name] is a 27 y.o. female history of asthma presenting today for evaluation of cough and scratchy throat.  Patient states that for the past 3 days she has had a scratchiness in her throat.  Has also had some cough, denies rhinorrhea.  Denies fevers.  Eating and drinking like normal.  Denies nausea, vomiting, abdominal pain; denies diarrhea.  She has not taken anything for symptoms.  2 of her sons here with similar symptoms.  Patient also notes that she was recently treated for UTI with Macrobid, since she has had some discharge and itching.  Requesting testing for this.  Denies pelvic pain.  HPI  Past Medical History:  Diagnosis Date  . Abnormal Pap smear    LAST PAP 09/2010  . Asthma 2010  . Gonorrhea 07/2013  . Headache(784.0)    FREQUENT  . Seizures (HCC) 2009   WITH MIGRAINE    Patient Active Problem List   Diagnosis Date Noted  . Pregnancy affected by fetal growth restriction 10/25/2014  . IUGR (intrauterine growth restriction) affecting care of mother   . [redacted] weeks gestation of pregnancy   . Gonorrhea 07/22/2013  . RLQ abdominal pain 07/21/2013  . SVD (spontaneous vaginal delivery) 12/16/2012  . H/O fetal biophysical profile 4/8 12/15/2012  . Asthma 07/16/2012    Past Surgical History:  Procedure Laterality Date  . LAPAROSCOPIC APPENDECTOMY N/A 07/22/2013   Procedure: APPENDECTOMY LAPAROSCOPIC;  Surgeon: Wilmon Arms. Corliss Skains, MD;  [facility];  Service: General;  Laterality: N/A;  . LAPAROSCOPY N/A 07/22/2013   Procedure: LAPAROSCOPY DIAGNOSTIC;  Surgeon: Wilmon Arms. Tsuei, MD;  Location: [facility];  Service: General;  Laterality: N/A;    OB History    Gravida  4   Para  4   Term  3   Preterm  1   AB  0   Living  4     SAB  0   TAB  0   Ectopic  0   Multiple  0   Live Births   4            Home Medications    Prior to Admission medications   Medication Sig Start Date End Date Taking? Authorizing Provider  Cetirizine HCl 10 MG CAPS Take 1 capsule (10 mg total) by mouth daily. 04/16/18   Wieters, Hallie C, PA-C  fluconazole (DIFLUCAN) 150 MG tablet Take 1 tablet (150 mg total) by mouth once for 1 dose. 04/16/18 04/16/18  Wieters, Junius Creamer, PA-C    Family History Family History  Problem Relation Age of Onset  . Hypertension Father   . Asthma Father   . Asthma Brother   . Arthritis Paternal Aunt     Social History Social History   Tobacco Use  . Smoking status: Never Smoker  . Smokeless tobacco: Never Used  Substance Use Topics  . Alcohol use: No    Comment: 07/22/2013 "birthday parties, etc; a few times/yr"LAST   TIME1-2015  . Drug use: No     Allergies   Patient has no known allergies.   Review of Systems Review of Systems  Constitutional: Negative for activity change, appetite change, chills, fatigue and fever.  HENT: Positive for sore throat. Negative for congestion, ear pain, rhinorrhea, sinus pressure and trouble swallowing.  Eyes: Negative for discharge and redness.  Respiratory: Positive for cough. Negative for chest tightness and shortness of breath.   Cardiovascular: Negative for chest pain.  Gastrointestinal: Negative for abdominal pain, diarrhea, nausea and vomiting.  Genitourinary: Positive for vaginal discharge. Negative for dysuria, flank pain, genital sores, hematuria, menstrual problem, vaginal bleeding and vaginal pain.  Musculoskeletal: Negative for back pain and myalgias.  Skin: Negative for rash.  Neurological: Negative for dizziness, light-headedness and headaches.     Physical Exam Triage Vital Signs ED Triage Vitals  Enc Vitals Group     BP 04/16/18 1224 99/69     Pulse Rate 04/16/18 1224 80     Resp 04/16/18 1224 19     Temp 04/16/18 1224 97.8 F (36.6 C)     Temp src --      SpO2 04/16/18 1224 99 %      Weight --      Height --      Head Circumference --      Peak Flow --      Pain Score 04/16/18 1218 0     Pain Loc --      Pain Edu? --      Excl. in GC? --    No data found.  Updated Vital Signs BP 99/69   Pulse 80   Temp 97.8 F (36.6 C)   Resp 19   LMP 03/25/2018   SpO2 99%   Visual Acuity Right Eye Distance:   Left Eye Distance:   Bilateral Distance:    Right Eye Near:   Left Eye Near:    Bilateral Near:     Physical Exam  Constitutional: She appears well-developed and well-nourished. No distress.  HENT:  Head: Normocephalic and atraumatic.  Bilateral ears without tenderness to palpation of external auricle, tragus and mastoid, EAC's without erythema or swelling, TM's with good bony landmarks and cone of light. Non erythematous.  Oral mucosa pink and moist, no tonsillar enlargement or exudate. Posterior pharynx patent and slightly erythematous- cobblestoning present, no uvula deviation or swelling. Normal phonation.  Eyes: Conjunctivae are normal.  Neck: Neck supple.  Cardiovascular: Normal rate and regular rhythm.  No murmur heard. Pulmonary/Chest: Effort normal and breath sounds normal. No respiratory distress.  Breathing comfortably at rest, CTABL, no wheezing, rales or other adventitious sounds auscultated  Abdominal: Soft. There is no tenderness.  Genitourinary:  Genitourinary Comments: deferred  Musculoskeletal: She exhibits no edema.  Neurological: She is alert.  Skin: Skin is warm and dry.  Psychiatric: She has a normal mood and affect.  Nursing note and vitals reviewed.    UC Treatments / Results  Labs (all labs ordered are listed, but only abnormal results are displayed) Labs Reviewed  CULTURE, GROUP A STREP [facility])  POCT RAPID STREP A  CERVICOVAGINAL ANCILLARY ONLY    EKG None  Radiology No results found.  Procedures Procedures (including critical care time)  Medications Ordered in UC Medications - No data to display  Initial  Impression / Assessment and Plan / UC Course  I have reviewed the triage vital signs and the nursing notes.  Pertinent labs & imaging results that were available during my care of the patient were reviewed by me and considered in my medical decision making (see chart for details).     Strep test negative, URI symptoms for 3 days, scratchy throat possible drainage, will recommend treatment with daily allergy pill.  Discussed further symptom medic management of sore throat. Given patient recently on antibiotics,  will empirically treat discharge with Diflucan for yeast infection.  Vaginal swab obtained to confirm and check for other causes of discharge. Discussed strict return precautions. Patient verbalized understanding and is agreeable with plan.  Final Clinical Impressions(s) / UC Diagnoses   Final diagnoses:  Viral URI with cough  Vaginal discharge     Discharge Instructions     Sore Throat  Your rapid strep tested Negative today. We will send for a culture and call in about 2 days if results are positive. For now we will treat your sore throat as a virus with symptom management.   Please continue Tylenol or Ibuprofen for fever and pain. May try salt water gargles, cepacol lozenges, throat spray, or OTC cold relief medicine for throat discomfort. If you also have congestion take a daily anti-histamine like Zyrtec, Claritin, and a oral decongestant to help with post nasal drip that may be irritating your throat.   Stay hydrated and drink plenty of fluids to keep your throat coated relieve irritation.  Discharge Please take 1 tablet of Diflucan today, may repeat in 3 days if symptoms still persisting.  This medicine is to treat a yeast infection.  We are testing you for Gonorrhea, Chlamydia, Trichomonas, Yeast and Bacterial Vaginosis. We will call you if anything is positive and let you know if you require any further treatment. Please inform partners of any positive results.   Please  return if symptoms not improving with treatment, development of fever, nausea, vomiting, abdominal pain.    ED Prescriptions    Medication Sig Dispense Auth. Provider   fluconazole (DIFLUCAN) 150 MG tablet Take 1 tablet (150 mg total) by mouth once for 1 dose. 2 tablet Wieters, Hallie C, PA-C   Cetirizine HCl 10 MG CAPS Take 1 capsule (10 mg total) by mouth daily. 15 capsule Wieters, Hallie C, PA-C     Controlled Substance Prescriptions  Controlled Substance Registry consulted? Not Applicable   Lew Dawes, [place] 04/16/18 1320

## 2018-04-18 ENCOUNTER — Encounter (HOSPITAL_COMMUNITY): Payer: Self-pay

## 2018-04-18 ENCOUNTER — Inpatient Hospital Stay (HOSPITAL_COMMUNITY)
Admission: AD | Admit: 2018-04-18 | Discharge: 2018-04-18 | Disposition: A | Discharge status: Home / Self Care | Payer: Self-pay | Source: Ambulatory Visit | Attending: Obstetrics & Gynecology | Admitting: Obstetrics & Gynecology

## 2018-04-18 DIAGNOSIS — A599 Trichomoniasis, unspecified: Secondary | ICD-10-CM

## 2018-04-18 DIAGNOSIS — Z8249 Family history of ischemic heart disease and other diseases of the circulatory system: Secondary | ICD-10-CM | POA: Insufficient documentation

## 2018-04-18 DIAGNOSIS — J45909 Unspecified asthma, uncomplicated: Secondary | ICD-10-CM | POA: Insufficient documentation

## 2018-04-18 DIAGNOSIS — N946 Dysmenorrhea, unspecified: Secondary | ICD-10-CM

## 2018-04-18 LAB — URINALYSIS, ROUTINE W REFLEX MICROSCOPIC
BILIRUBIN URINE: NEGATIVE
Bacteria, UA: NONE SEEN
GLUCOSE, UA: NEGATIVE mg/dL
Ketones, ur: NEGATIVE mg/dL
NITRITE: NEGATIVE
Protein, ur: NEGATIVE mg/dL
Specific Gravity, Urine: 1.011 (ref 1.005–1.030)
pH: 7 (ref 5.0–8.0)

## 2018-04-18 LAB — WET PREP, GENITAL
CLUE CELLS WET PREP: NONE SEEN
Sperm: NONE SEEN
Yeast Wet Prep HPF POC: NONE SEEN

## 2018-04-18 LAB — CULTURE, GROUP A STREP (THRC)

## 2018-04-18 LAB — POCT PREGNANCY, URINE: Preg Test, Ur: NEGATIVE

## 2018-04-18 MED ORDER — METRONIDAZOLE 500 MG PO TABS
2000.0000 mg | ORAL_TABLET | Freq: Once | ORAL | Status: AC
  Administered 2018-04-18: 2000 mg via ORAL
  Filled 2018-04-18: qty 4

## 2018-04-18 MED ORDER — IBUPROFEN 800 MG PO TABS
800.0000 mg | ORAL_TABLET | Freq: Once | ORAL | Status: AC
  Administered 2018-04-18: 800 mg via ORAL
  Filled 2018-04-18: qty 1

## 2018-04-18 NOTE — MAU Provider Note (Addendum)
History     CSN: [id number]  Arrival date and time: 04/18/18 1437   First Provider Initiated Contact with Patient 04/18/18 1630      Chief Complaint  Patient presents with  . Pelvic Pain  . Vaginal Bleeding   Pelvic Pain  The patient's primary symptoms include pelvic pain and vaginal bleeding. The patient's pertinent negatives include no vaginal discharge. This is a new problem. The current episode started yesterday. The problem occurs intermittently. The problem has been unchanged. Pain severity now: 8/10. The problem affects both sides. She is not pregnant. Pertinent negatives include no chills, dysuria, fever, frequency, nausea, urgency or vomiting. The vaginal discharge was normal. The vaginal bleeding is spotting. She has not been passing clots. She has not been passing tissue. Nothing aggravates the symptoms. She has tried nothing for the symptoms. She uses nothing for contraception. Her menstrual history has been irregular (LMP 03/02/18 ).  Vaginal Bleeding  The patient's primary symptoms include pelvic pain and vaginal bleeding. The patient's pertinent negatives include no vaginal discharge. Pertinent negatives include no chills, dysuria, fever, frequency, nausea, urgency or vomiting.    OB History    Gravida  4   Para  4   Term  3   Preterm  1   AB  0   Living  4     SAB  0   TAB  0   Ectopic  0   Multiple  0   Live Births  4           Past Medical History:  Diagnosis Date  . Abnormal Pap smear    LAST PAP 09/2010  . Asthma 2010   has not used inhaler "in a while" 04/18/18  . Gonorrhea 07/2013  . Headache(784.0)    FREQUENT  . Seizures (HCC) 2009   WITH MIGRAINE; happened once    Past Surgical History:  Procedure Laterality Date  . LAPAROSCOPIC APPENDECTOMY N/A 07/22/2013   Procedure: APPENDECTOMY LAPAROSCOPIC;  Surgeon: Wilmon Arms. Corliss Skains, MD;  [facility];  Service: General;  Laterality: N/A;  . LAPAROSCOPY N/A 07/22/2013   Procedure:  LAPAROSCOPY DIAGNOSTIC;  Surgeon: Wilmon Arms. Corliss Skains, MD;  Location[facility];  Service: General;  Laterality: N/A;    Family History  Problem Relation Age of Onset  . Hypertension Father   . Asthma Father   . Asthma Brother   . Arthritis Paternal Aunt     Social History   Tobacco Use  . Smoking status: Never Smoker  . Smokeless tobacco: Never Used  Substance Use Topics  . Alcohol use: No    Comment: 07/22/2013 "birthday parties, etc; a few times/yr"LAST   TIME1-2015  . Drug use: No    Allergies: No Known Allergies  Medications Prior to Admission  Medication Sig Dispense Refill Last Dose  . Cetirizine HCl 10 MG CAPS Take 1 capsule (10 mg total) by mouth daily. 15 capsule 0     Review of Systems  Constitutional: Negative for chills and fever.  Gastrointestinal: Negative for nausea and vomiting.  Genitourinary: Positive for pelvic pain and vaginal bleeding. Negative for dysuria, frequency, urgency and vaginal discharge.   Physical Exam   Blood pressure 104/64, pulse 70, temperature 97.9 F (36.6 C), temperature source Oral, resp. rate 16, weight 58.1 kg, last menstrual period 03/02/2018, unknown if currently breastfeeding.  Physical Exam  Nursing note and vitals reviewed. Constitutional: She is oriented to person, place, and time. She appears well-developed and well-nourished. No distress.  HENT:  Head: Normocephalic.  Cardiovascular: Normal rate.  Respiratory: Effort normal.  GI: Soft. There is no tenderness. There is no rebound.  Genitourinary:  Genitourinary Comments:  External: no lesion Vagina: scant amount of blood noted  Cervix: pink, smooth, no CMT Uterus: NSSC Adnexa: NT   Neurological: She is alert and oriented to person, place, and time.  Skin: Skin is warm and dry.  Psychiatric: She has a normal mood and affect.   Results for orders placed or performed during the hospital encounter of 04/18/18 (from the past 24 hour(s))  Urinalysis, Routine w reflex  microscopic     Status: Abnormal   Collection Time: 04/18/18  4:13 PM  Result Value Ref Range   Color, Urine YELLOW YELLOW   APPearance CLEAR CLEAR   Specific Gravity, Urine 1.011 1.005 - 1.030   pH 7.0 5.0 - 8.0   Glucose, UA NEGATIVE NEGATIVE mg/dL   Hgb urine dipstick MODERATE (A) NEGATIVE   Bilirubin Urine NEGATIVE NEGATIVE   Ketones, ur NEGATIVE NEGATIVE mg/dL   Protein, ur NEGATIVE NEGATIVE mg/dL   Nitrite NEGATIVE NEGATIVE   Leukocytes, UA MODERATE (A) NEGATIVE   RBC / HPF 0-5 0 - 5 RBC/hpf   WBC, UA 6-10 0 - 5 WBC/hpf   Bacteria, UA NONE SEEN NONE SEEN   Squamous Epithelial / LPF 6-10 0 - 5   Mucus PRESENT   Pregnancy, urine POC     Status: None   Collection Time: 04/18/18  4:17 PM  Result Value Ref Range   Preg Test, Ur NEGATIVE NEGATIVE  Wet prep, genital     Status: Abnormal   Collection Time: 04/18/18  4:37 PM  Result Value Ref Range   Yeast Wet Prep HPF POC NONE SEEN NONE SEEN   Trich, Wet Prep PRESENT (A) NONE SEEN   Clue Cells Wet Prep HPF POC NONE SEEN NONE SEEN   WBC, Wet Prep HPF POC FEW (A) NONE SEEN   Sperm NONE SEEN      MAU Course  Procedures  MDM Patient has had 800mg  ibuprofen. She reports her pain has resolved  +trichomoniasis treated with 2g flagyl here in MAU today  DW patient about after hours clinic for follow up if needed, and that once we move we won't be able to see GYN patients. Patient appreciative of info on GYN clinic.   Assessment and Plan   1. Dysmenorrhea   2. Trichomoniasis     Follow-up Information    Department, [facility] Follow up.   Contact information: [address] [place] [address] [phone]           DC home Comfort measures reviewed  RX: 2g flagyl PO X1 for EPT  Return to MAU as needed   Follow-up Information    Department, [facility] Follow up.   Contact information: [address] [place] [address] [phone]            Thressa Sheller 04/18/2018, 5:10 PM

## 2018-04-18 NOTE — Discharge Instructions (Signed)
In late 2019, the [facility] will be moving to the [facility] campus. At that time, the MAU (Maternity Admissions Unit), where you are being seen today, will no longer take care of non-pregnant patients. We strongly encourage you to find a doctor's office before that time, so that you can be seen with any GYN concerns, like vaginal discharge, urinary tract infection, etc.. in a timely manner.  In order to make an office visit more convenient, the Center for [facility] Healthcare at [facility] will be offering evening hours with same-day appointments, walk-in appointments and scheduled appointments available during this time.  Center for [facility] Hours: Monday - 8am - 7:30 pm with walk-in between 4pm- 7:30 pm Tuesday - 8 am - 5 pm (starting 10/13/17 we will be open late and accepting walk-ins from 4pm - 7:30pm) Wednesday - 8 am - 5 pm (starting 01/13/18 we will be open late and accepting walk-ins from 4pm - 7:30pm) Thursday 8 am - 5 pm (starting 04/15/18 we will be open late and accepting walk-ins from 4pm - 7:30pm) Friday 8 am - 5 pm  For an appointment please call the Center for [facility] Healthcare @ [facility] at [phone]  For urgent needs, [facility] is also available for management of urgent GYN complaints such as vaginal discharge or urinary tract infections.   Center for [facility] OBGYN offices           Center for [facility] Healthcare @ [facility]   Phone: [phone]  Center for [facility] Healthcare @ [facility]   Phone: [phone]  Center For [facility]  [facility]       Phone: [phone]            Center for [facility] Healthcare @ [place]     Phone: [phone]          Center for [facility] Healthcare @ [place]   Phone: [phone]  Center for [facility] Healthcare @ [facility]  Phone: [phone]     Family [address] [place])  Phone: [phone]   Trichomoniasis Trichomoniasis is an STI (sexually transmitted  infection) that can affect both women and men. In women, the outer area of the female genitalia (vulva) and the vagina are affected. In men, the penis is mainly affected, but the prostate and other reproductive organs can also be involved. This condition can be treated with medicine. It often has no symptoms (is asymptomatic), especially in men. What are the causes? This condition is caused by an organism called Trichomonas vaginalis. Trichomoniasis most often spreads from person to person (is contagious) through sexual contact. What increases the risk? The following factors may make you more likely to develop this condition:  Having unprotected sexual intercourse.  Having sexual intercourse with a partner who has trichomoniasis.  Having multiple sexual partners.  Having had previous trichomoniasis infections or other STIs.  What are the signs or symptoms? In women, symptoms of trichomoniasis include:  Abnormal vaginal discharge that is clear, white, gray, or yellow-green and foamy and has an unusual "fishy" odor.  Itching and irritation of the vagina and vulva.  Burning or pain during urination or sexual intercourse.  Genital redness and swelling.  In men, symptoms of trichomoniasis include:  Penile discharge that may be foamy or contain pus.  Pain in the penis. This may happen only when urinating.  Itching or irritation inside the penis.  Burning after urination or ejaculation.  How is this diagnosed? In women, this condition may be found  during a routine Pap test or physical exam. It may be found in men during a routine physical exam. Your health care provider may perform tests to help diagnose this infection, such as:  Urine tests (men and women).  The following in women: ? Testing the pH of the vagina. ? A vaginal swab test that checks for the Trichomonas vaginalis organism. ? Testing vaginal secretions.  Your health care provider may test you for other STIs,  including HIV (human immunodeficiency virus). How is this treated? This condition is treated with medicine taken by mouth (orally), such as metronidazole or tinidazole to fight the infection. Your sexual partner(s) may also need to be tested and treated.  If you are a woman and you plan to become pregnant or think you may be pregnant, tell your health care provider right away. Some medicines that are used to treat the infection should not be taken during pregnancy.  Your health care provider may recommend over-the-counter medicines or creams to help relieve itching or irritation. You may be tested for infection again 3 months after treatment. Follow these instructions at home:  Take and use over-the-counter and prescription medicines, including creams, only as told by your health care provider.  Do not have sexual intercourse until one week after you finish your medicine, or until your health care provider approves. Ask your health care provider when you may resume sexual intercourse.  (Women) Do not douche or wear tampons while you have the infection.  Discuss your infection with your sexual partner(s). Make sure that your partner gets tested and treated, if necessary.  Keep all follow-up visits as told by your health care provider. This is important. How is this prevented?  Use condoms every time you have sex. Using condoms correctly and consistently can help protect against STIs.  Avoid having multiple sexual partners.  Talk with your sexual partner about any symptoms that either of you may have, as well as any history of STIs.  Get tested for STIs and STDs (sexually transmitted diseases) before you have sex. Ask your partner to do the same.  Do not have sexual contact if you have symptoms of trichomoniasis or another STI. Contact a health care provider if:  You still have symptoms after you finish your medicine.  You develop pain in your abdomen.  You have pain when you  urinate.  You have bleeding after sexual intercourse.  You develop a rash.  You feel nauseous or you vomit.  You plan to become pregnant or think you may be pregnant. Summary  Trichomoniasis is an STI (sexually transmitted infection) that can affect both women and men.  This condition often has no symptoms (is asymptomatic), especially in men.  You should not have sexual intercourse until one week after you finish your medicine, or until your health care provider approves. Ask your health care provider when you may resume sexual intercourse.  Discuss your infection with your sexual partner. Make sure that your partner gets tested and treated, if necessary. This information is not intended to replace advice given to you by your health care provider. Make sure you discuss any questions you have with your health care provider. Document Released: 12/24/2000 Document Revised: 05/23/2016 Document Reviewed: 05/23/2016 Elsevier Interactive Patient Education  2017 [employer].

## 2018-04-18 NOTE — MAU Note (Signed)
Cramps since yesterday  Spotting early in the week that has continued  Saw some tissue looking thing in the toilet, states it was pink  LMP 03/02/18 Had some spotting in September but no period

## 2018-04-19 LAB — CERVICOVAGINAL ANCILLARY ONLY
BACTERIAL VAGINITIS: POSITIVE — AB
CHLAMYDIA, DNA PROBE: NEGATIVE
Candida vaginitis: NEGATIVE
Neisseria Gonorrhea: NEGATIVE
TRICH (WINDOWPATH): POSITIVE — AB

## 2018-04-19 LAB — URINE CULTURE

## 2018-04-19 LAB — GC/CHLAMYDIA PROBE AMP (~~LOC~~) NOT AT ARMC
Chlamydia: NEGATIVE
NEISSERIA GONORRHEA: NEGATIVE

## 2018-08-20 ENCOUNTER — Emergency Department
Admission: EM | Admit: 2018-08-20 | Discharge: 2018-08-20 | Disposition: A | Discharge status: Home / Self Care | Payer: Medicaid Other | Attending: Emergency Medicine | Admitting: Emergency Medicine

## 2018-08-20 DIAGNOSIS — J45909 Unspecified asthma, uncomplicated: Secondary | ICD-10-CM | POA: Insufficient documentation

## 2018-08-20 DIAGNOSIS — R509 Fever, unspecified: Secondary | ICD-10-CM | POA: Diagnosis present

## 2018-08-20 DIAGNOSIS — J111 Influenza due to unidentified influenza virus with other respiratory manifestations: Secondary | ICD-10-CM | POA: Diagnosis not present

## 2018-08-20 MED ORDER — ACETAMINOPHEN 325 MG PO TABS: 650.0000 mg | ORAL_TABLET | ORAL | 0 refills | Status: DC | PRN

## 2018-08-20 MED ORDER — ACETAMINOPHEN 325 MG PO TABS
650.0000 mg | ORAL_TABLET | Freq: Once | ORAL | Status: AC
  Administered 2018-08-20: 650 mg via ORAL
  Filled 2018-08-20: qty 2

## 2018-08-20 MED ORDER — OSELTAMIVIR PHOSPHATE 75 MG PO CAPS: 75.0000 mg | ORAL_CAPSULE | Freq: Two times a day (BID) | ORAL | 0 refills | Status: AC

## 2018-08-20 MED ORDER — IBUPROFEN 600 MG PO TABS: 600.0000 mg | ORAL_TABLET | Freq: Four times a day (QID) | ORAL | 0 refills | Status: DC | PRN

## 2018-08-20 MED ORDER — KETOROLAC TROMETHAMINE 30 MG/ML IJ SOLN
30.0000 mg | Freq: Once | INTRAMUSCULAR | Status: AC
  Administered 2018-08-20: 30 mg via INTRAMUSCULAR
  Filled 2018-08-20: qty 1

## 2018-08-20 MED ORDER — BENZONATATE 100 MG PO CAPS: 100.0000 mg | ORAL_CAPSULE | Freq: Three times a day (TID) | ORAL | 0 refills | Status: DC | PRN

## 2018-08-20 NOTE — ED Provider Notes (Signed)
[facility] Emergency Department Provider Note  ____________________________________________  Time seen: Approximately 4:29 PM  I have reviewed the triage vital signs and the nursing notes.   HISTORY  Chief Complaint Generalized Body Aches; Cough; and Chills    HPI Laurie [name] is a 28 y.o. female that presents emergency department for evaluation of fever, chills, cough, body aches for 1 day.  Patient states that body aches are the worst.  No sick contacts.  Patient does not smoke.  No vomiting, abdominal pain, diarrhea.   Past Medical History:  Diagnosis Date  . Abnormal Pap smear    LAST PAP 09/2010  . Asthma 2010   has not used inhaler "in a while" 04/18/18  . Gonorrhea 07/2013  . Headache(784.0)    FREQUENT  . Seizures (HCC) 2009   WITH MIGRAINE; happened once    Patient Active Problem List   Diagnosis Date Noted  . Pregnancy affected by fetal growth restriction 10/25/2014  . IUGR (intrauterine growth restriction) affecting care of mother   . [redacted] weeks gestation of pregnancy   . Gonorrhea 07/22/2013  . RLQ abdominal pain 07/21/2013  . SVD (spontaneous vaginal delivery) 12/16/2012  . H/O fetal biophysical profile 4/8 12/15/2012  . Asthma 07/16/2012    Past Surgical History:  Procedure Laterality Date  . LAPAROSCOPIC APPENDECTOMY N/A 07/22/2013   Procedure: APPENDECTOMY LAPAROSCOPIC;  Surgeon: Wilmon ArmsMatthew K. Corliss Skainssuei, MD;  Location: [facility] OR;  Service: General;  Laterality: N/A;  . LAPAROSCOPY N/A 07/22/2013   Procedure: LAPAROSCOPY DIAGNOSTIC;  Surgeon: Wilmon ArmsMatthew K. Corliss Skainssuei, MD;  Location: [facility] OR;  Service: General;  Laterality: N/A;    Prior to Admission medications   Medication Sig Start Date End Date Taking? Authorizing Provider  acetaminophen (TYLENOL) 325 MG tablet Take 2 tablets (650 mg total) by mouth every 4 (four) hours as needed. 08/20/18 08/20/19  Enid DerryWagner, Laneshia Pina, PA-C  benzonatate (TESSALON PERLES) 100 MG capsule Take 1 capsule (100 mg  total) by mouth 3 (three) times daily as needed for cough. 08/20/18 08/20/19  Enid DerryWagner, Arwyn Besaw, PA-C  Cetirizine HCl 10 MG CAPS Take 1 capsule (10 mg total) by mouth daily. 04/16/18   Wieters, Hallie C, PA-C  ibuprofen (ADVIL,MOTRIN) 600 MG tablet Take 1 tablet (600 mg total) by mouth every 6 (six) hours as needed. 08/20/18   Enid DerryWagner, Kalaya Infantino, PA-C  oseltamivir (TAMIFLU) 75 MG capsule Take 1 capsule (75 mg total) by mouth 2 (two) times daily for 5 days. 08/20/18 08/25/18  Enid DerryWagner, Dorma Altman, PA-C    Allergies Patient has no known allergies.  Family History  Problem Relation Age of Onset  . Hypertension Father   . Asthma Father   . Asthma Brother   . Arthritis Paternal Aunt     Social History Social History   Tobacco Use  . Smoking status: Never Smoker  . Smokeless tobacco: Never Used  Substance Use Topics  . Alcohol use: No    Comment: 07/22/2013 "birthday parties, etc; a few times/yr"LAST   TIME1-2015  . Drug use: No     Review of Systems  Constitutional: Positive for fever. Eyes: No visual changes. No discharge. ENT: Positive for congestion and rhinorrhea. Cardiovascular: No chest pain. Respiratory: Positive for cough. No SOB. Gastrointestinal: No abdominal pain.  No nausea, no vomiting.  No diarrhea.  No constipation. Musculoskeletal: Positive for body aches. Skin: Negative for rash, abrasions, lacerations, ecchymosis. Neurological: Negative for headaches.   ____________________________________________   PHYSICAL EXAM:  VITAL SIGNS: ED Triage Vitals  Enc Vitals Group  BP 08/20/18 1418 103/72     Pulse Rate 08/20/18 1418 (!) 110     Resp 08/20/18 1418 20     Temp 08/20/18 1417 99.8 F (37.7 C)     Temp Source 08/20/18 1417 Oral     SpO2 08/20/18 1418 100 %     Weight 08/20/18 1415 129 lb (58.5 kg)     Height 08/20/18 1415 5\' 5"  (1.651 m)     Head Circumference --      Peak Flow --      Pain Score 08/20/18 1414 9     Pain Loc --      Pain Edu? --      Excl. in GC? --       Constitutional: Alert and oriented. Well appearing and in no acute distress. Eyes: Conjunctivae are normal. PERRL. EOMI. No discharge. Head: Atraumatic. ENT: No frontal and maxillary sinus tenderness.      Ears: Tympanic membranes pearly gray with good landmarks. No discharge.      Nose: Mild congestion/rhinnorhea.      Mouth/Throat: Mucous membranes are moist. Oropharynx non-erythematous. Tonsils not enlarged. No exudates. Uvula midline. Neck: No stridor.   Hematological/Lymphatic/Immunilogical: No cervical lymphadenopathy. Cardiovascular: Normal rate, regular rhythm.  Good peripheral circulation. Respiratory: Normal respiratory effort without tachypnea or retractions. Lungs CTAB. Good air entry to the bases with no decreased or absent breath sounds. Gastrointestinal: Bowel sounds 4 quadrants. Soft and nontender to palpation. No guarding or rigidity. No palpable masses. No distention. Musculoskeletal: Full range of motion to all extremities. No gross deformities appreciated. Neurologic:  Normal speech and language. No gross focal neurologic deficits are appreciated.  Skin:  Skin is warm, dry and intact. No rash noted. Psychiatric: Mood and affect are normal. Speech and behavior are normal. Patient exhibits appropriate insight and judgement.   ____________________________________________   LABS (all labs ordered are listed, but only abnormal results are displayed)  Labs Reviewed - No data to display ____________________________________________  EKG   ____________________________________________  RADIOLOGY   No results found.  ____________________________________________    PROCEDURES  Procedure(s) performed:    Procedures    Medications  ketorolac (TORADOL) 30 MG/ML injection 30 mg (30 mg Intramuscular Given 08/20/18 1643)  acetaminophen (TYLENOL) tablet 650 mg (650 mg Oral Given 08/20/18 1643)     ____________________________________________   INITIAL  IMPRESSION / ASSESSMENT AND PLAN / ED COURSE  Pertinent labs & imaging results that were available during my care of the patient were reviewed by me and considered in my medical decision making (see chart for details).  Review of the [place] CSRS was performed in accordance of the [facility] prior to dispensing any controlled drugs.     Patient's diagnosis is consistent with influenza. Vital signs and exam are reassuring. Patient appears well and is staying well hydrated. Patient should alternate tylenol and ibuprofen for fever. Patient feels comfortable going home. Patient will be discharged home with prescriptions for Tamiflu, tylenol, motrin, tessalon perles. Patient is to follow up with PCP as needed or otherwise directed. Patient is given ED precautions to return to the ED for any worsening or new symptoms.     ____________________________________________  FINAL CLINICAL IMPRESSION(S) / ED DIAGNOSES  Final diagnoses:  Influenza      NEW MEDICATIONS STARTED DURING THIS VISIT:  ED Discharge Orders         Ordered    oseltamivir (TAMIFLU) 75 MG capsule  2 times daily     08/20/18 1709    acetaminophen (  TYLENOL) 325 MG tablet  Every 4 hours PRN     08/20/18 1709    ibuprofen (ADVIL,MOTRIN) 600 MG tablet  Every 6 hours PRN     08/20/18 1709    benzonatate (TESSALON PERLES) 100 MG capsule  3 times daily PRN     08/20/18 1709              This chart was dictated using voice recognition software/Dragon. Despite best efforts to proofread, errors can occur which can change the meaning. Any change was purely unintentional.    Enid Derry, PA-C 08/20/18 1750    Don Perking, [place], MD 08/20/18 2228

## 2018-08-20 NOTE — ED Triage Notes (Signed)
Pt reports bodyaches and chills since this am and a cough, reports wants to get checked out.

## 2018-08-20 NOTE — ED Notes (Signed)
See triage note  Presents with body aches,chills and low grade fever  sxs' started today

## 2018-09-10 ENCOUNTER — Emergency Department: Payer: Medicaid Other

## 2018-09-10 ENCOUNTER — Encounter: Payer: Self-pay | Admitting: Emergency Medicine

## 2018-09-10 ENCOUNTER — Emergency Department
Admission: EM | Admit: 2018-09-10 | Discharge: 2018-09-10 | Disposition: A | Discharge status: Home / Self Care | Payer: Medicaid Other | Attending: Emergency Medicine | Admitting: Emergency Medicine

## 2018-09-10 ENCOUNTER — Other Ambulatory Visit: Payer: Self-pay

## 2018-09-10 DIAGNOSIS — J9801 Acute bronchospasm: Secondary | ICD-10-CM | POA: Diagnosis not present

## 2018-09-10 DIAGNOSIS — J45909 Unspecified asthma, uncomplicated: Secondary | ICD-10-CM | POA: Insufficient documentation

## 2018-09-10 DIAGNOSIS — Z79899 Other long term (current) drug therapy: Secondary | ICD-10-CM | POA: Diagnosis not present

## 2018-09-10 DIAGNOSIS — R05 Cough: Secondary | ICD-10-CM | POA: Diagnosis present

## 2018-09-10 MED ORDER — PROMETHAZINE-CODEINE 6.25-10 MG/5ML PO SYRP: 5.0000 mL | ORAL_SOLUTION | Freq: Four times a day (QID) | ORAL | 0 refills | Status: DC | PRN

## 2018-09-10 MED ORDER — BENZONATATE 100 MG PO CAPS
200.0000 mg | ORAL_CAPSULE | Freq: Once | ORAL | Status: AC
  Administered 2018-09-10: 200 mg via ORAL
  Filled 2018-09-10: qty 2

## 2018-09-10 MED ORDER — PREDNISONE 20 MG PO TABS
60.0000 mg | ORAL_TABLET | Freq: Once | ORAL | Status: AC
  Administered 2018-09-10: 60 mg via ORAL
  Filled 2018-09-10: qty 3

## 2018-09-10 MED ORDER — METHYLPREDNISOLONE 4 MG PO TBPK: ORAL_TABLET | ORAL | 0 refills | Status: DC

## 2018-09-10 NOTE — ED Provider Notes (Signed)
[facility] Emergency Department Provider Note   ____________________________________________   First MD Initiated Contact with Patient 09/10/18 1421     (approximate)  I have reviewed the triage vital signs and the nursing notes.   HISTORY  Chief Complaint Cough    HPI Laurie [name] is a 28 y.o. female patient states she was diagnosed with flu on August 03, 2018.  Patient states she did not fill the prescription for Tamiflu.  Patient states she has had a persisting cough since her diagnosis.  Patient unsure of fever.  Patient does have chills.  Patient denies nausea, vomiting, diarrhea.  Patient states  headache secondary to continue cough.   No palliative measure for complaint.   Past Medical History:  Diagnosis Date  . Abnormal Pap smear    LAST PAP 09/2010  . Asthma 2010   has not used inhaler "in a while" 04/18/18  . Gonorrhea 07/2013  . Headache(784.0)    FREQUENT  . Seizures (HCC) 2009   WITH MIGRAINE; happened once    Patient Active Problem List   Diagnosis Date Noted  . Pregnancy affected by fetal growth restriction 10/25/2014  . IUGR (intrauterine growth restriction) affecting care of mother   . [redacted] weeks gestation of pregnancy   . Gonorrhea 07/22/2013  . RLQ abdominal pain 07/21/2013  . SVD (spontaneous vaginal delivery) 12/16/2012  . H/O fetal biophysical profile 4/8 12/15/2012  . Asthma 07/16/2012    Past Surgical History:  Procedure Laterality Date  . APPENDECTOMY    . LAPAROSCOPIC APPENDECTOMY N/A 07/22/2013   Procedure: APPENDECTOMY LAPAROSCOPIC;  Surgeon: Wilmon Arms. Corliss Skains, MD;  Location: [facility] OR;  Service: General;  Laterality: N/A;  . LAPAROSCOPY N/A 07/22/2013   Procedure: LAPAROSCOPY DIAGNOSTIC;  Surgeon: Wilmon Arms. Corliss Skains, MD;  Location: [facility] OR;  Service: General;  Laterality: N/A;    Prior to Admission medications   Medication Sig Start Date End Date Taking? Authorizing Provider  acetaminophen (TYLENOL) 325 MG  tablet Take 2 tablets (650 mg total) by mouth every 4 (four) hours as needed. 08/20/18 08/20/19  Enid Derry, PA-C  benzonatate (TESSALON PERLES) 100 MG capsule Take 1 capsule (100 mg total) by mouth 3 (three) times daily as needed for cough. 08/20/18 08/20/19  Enid Derry, PA-C  Cetirizine HCl 10 MG CAPS Take 1 capsule (10 mg total) by mouth daily. 04/16/18   Wieters, Hallie C, PA-C  ibuprofen (ADVIL,MOTRIN) 600 MG tablet Take 1 tablet (600 mg total) by mouth every 6 (six) hours as needed. 08/20/18   Enid Derry, PA-C  methylPREDNISolone (MEDROL DOSEPAK) 4 MG TBPK tablet Take Tapered dose as directed 09/10/18   Joni Reining, PA-C  promethazine-codeine [facility] WITH CODEINE) 6.25-10 MG/5ML syrup Take 5 mLs by mouth every 6 (six) hours as needed for cough. 09/10/18   Joni Reining, PA-C    Allergies Patient has no known allergies.  Family History  Problem Relation Age of Onset  . Hypertension Father   . Asthma Father   . Asthma Brother   . Arthritis Paternal Aunt     Social History Social History   Tobacco Use  . Smoking status: Never Smoker  . Smokeless tobacco: Never Used  Substance Use Topics  . Alcohol use: No    Comment: 07/22/2013 "birthday parties, etc; a few times/yr"LAST   TIME1-2015  . Drug use: No    Review of Systems Constitutional: No fever/chills Eyes: No visual changes. ENT: No sore throat. Cardiovascular: Denies chest pain. Respiratory: Denies shortness of  breath.  Productive cough. Gastrointestinal: No abdominal pain.  No nausea, no vomiting.  No diarrhea.  No constipation. Genitourinary: Negative for dysuria. Musculoskeletal: Negative for back pain. Skin: Negative for rash. Neurological: Positive for headaches, but denies focal weakness or numbness.   ____________________________________________   PHYSICAL EXAM:  VITAL SIGNS: ED Triage Vitals  Enc Vitals Group     BP 09/10/18 1414 100/64     Pulse Rate 09/10/18 1414 85     Resp 09/10/18 1414 18       Temp 09/10/18 1414 98.7 F (37.1 C)     Temp Source 09/10/18 1414 Oral     SpO2 09/10/18 1414 100 %     Weight 09/10/18 1413 129 lb (58.5 kg)     Height 09/10/18 1413 5\' 5"  (1.651 m)     Head Circumference --      Peak Flow --      Pain Score 09/10/18 1413 0     Pain Loc --      Pain Edu? --      Excl. in GC? --    Constitutional: Alert and oriented. Well appearing and in no acute distress. Nose: Edematous nasal turbinates clear rhinorrhea. Mouth/Throat: Mucous membranes are moist.  Oropharynx non-erythematous.  Postnasal drainage. Neck: No stridor.  Hematological/Lymphatic/Immunilogical: No cervical lymphadenopathy. Cardiovascular: Normal rate, regular rhythm. Grossly normal heart sounds.  Good peripheral circulation. Respiratory: Normal respiratory effort.  No retractions. Lungs nonproductive cough and wheezing. Musculoskeletal: No lower extremity tenderness nor edema.  No joint effusions. Neurologic:  Normal speech and language. No gross focal neurologic deficits are appreciated. No gait instability. Skin:  Skin is warm, dry and intact. No rash noted. Psychiatric: Mood and affect are normal. Speech and behavior are normal.  ____________________________________________   LABS (all labs ordered are listed, but only abnormal results are displayed)  Labs Reviewed - No data to display ____________________________________________  EKG   ____________________________________________  RADIOLOGY  ED MD interpretation:    Official radiology report(s): Dg Chest 2 View  Result Date: 09/10/2018 CLINICAL DATA:  Cough EXAM: CHEST - 2 VIEW COMPARISON:  None. FINDINGS: Lungs are clear. Heart size and pulmonary vascularity are normal. No adenopathy. No bone lesions. IMPRESSION: No edema or consolidation. Electronically Signed   By: Bretta Bang III M.D.   On: 09/10/2018 15:28    ____________________________________________   PROCEDURES  Procedure(s) performed (including  Critical Care):  Procedures   ____________________________________________   INITIAL IMPRESSION / ASSESSMENT AND PLAN / ED COURSE  As part of my medical decision making, I reviewed the following data within the electronic MEDICAL RECORD NUMBER    Patient presents with continued cough over 1 month after being diagnosed with influenza.  Chest x-ray was unremarkable.  Patient has mild wheezing after coughing spells was consistent with bronchospasms.  Patient given discharge care instruction advised take medication as directed.  Patient [facility]       ____________________________________________   FINAL CLINICAL IMPRESSION(S) / ED DIAGNOSES  Final diagnoses:  Cough due to bronchospasm     ED Discharge Orders         Ordered    methylPREDNISolone (MEDROL DOSEPAK) 4 MG TBPK tablet     09/10/18 1537    promethazine-codeine (PHENERGAN WITH CODEINE) 6.25-10 MG/5ML syrup  Every 6 hours PRN     09/10/18 1537           Note:  This document was prepared using Dragon voice recognition software and may include unintentional  dictation errors.    Joni ReiningSmith, Ronald K, PA-C 09/10/18 1540    Jeanmarie PlantMcShane, James A, MD 09/11/18 [phone]

## 2018-09-10 NOTE — ED Triage Notes (Signed)
States had flu on January 31.  States cough has persisted.

## 2018-09-23 ENCOUNTER — Emergency Department
Admission: EM | Admit: 2018-09-23 | Discharge: 2018-09-23 | Discharge status: Against Medical Advice | Payer: Medicaid Other | Attending: Student in an Organized Health Care Education/Training Program | Admitting: Student in an Organized Health Care Education/Training Program

## 2018-09-23 ENCOUNTER — Encounter: Payer: Self-pay | Admitting: Emergency Medicine

## 2018-09-23 ENCOUNTER — Other Ambulatory Visit: Payer: Self-pay

## 2018-09-23 ENCOUNTER — Emergency Department: Payer: Medicaid Other

## 2018-09-23 DIAGNOSIS — N939 Abnormal uterine and vaginal bleeding, unspecified: Secondary | ICD-10-CM | POA: Insufficient documentation

## 2018-09-23 DIAGNOSIS — R102 Pelvic and perineal pain: Secondary | ICD-10-CM | POA: Diagnosis not present

## 2018-09-23 LAB — COMPREHENSIVE METABOLIC PANEL
ALT: 23 U/L (ref 0–44)
AST: 24 U/L (ref 15–41)
Albumin: 3.9 g/dL (ref 3.5–5.0)
Alkaline Phosphatase: 57 U/L (ref 38–126)
Anion gap: 7 (ref 5–15)
BILIRUBIN TOTAL: 0.4 mg/dL (ref 0.3–1.2)
BUN: 13 mg/dL (ref 6–20)
CALCIUM: 9.2 mg/dL (ref 8.9–10.3)
CHLORIDE: 108 mmol/L (ref 98–111)
CO2: 24 mmol/L (ref 22–32)
Creatinine, Ser: 0.66 mg/dL (ref 0.44–1.00)
Glucose, Bld: 97 mg/dL (ref 70–99)
Potassium: 3.4 mmol/L — ABNORMAL LOW (ref 3.5–5.1)
Sodium: 139 mmol/L (ref 135–145)
Total Protein: 7.7 g/dL (ref 6.5–8.1)

## 2018-09-23 LAB — CBC WITH DIFFERENTIAL/PLATELET
ABS IMMATURE GRANULOCYTES: 0.02 10*3/uL (ref 0.00–0.07)
BASOS PCT: 1 %
Basophils Absolute: 0 10*3/uL (ref 0.0–0.1)
EOS ABS: 0.2 10*3/uL (ref 0.0–0.5)
Eosinophils Relative: 3 %
HEMATOCRIT: 36.5 % (ref 36.0–46.0)
Hemoglobin: 11.3 g/dL — ABNORMAL LOW (ref 12.0–15.0)
Immature Granulocytes: 0 %
LYMPHS ABS: 2.6 10*3/uL (ref 0.7–4.0)
Lymphocytes Relative: 39 %
MCH: 28.8 pg (ref 26.0–34.0)
MCHC: 31 g/dL (ref 30.0–36.0)
MCV: 93.1 fL (ref 80.0–100.0)
MONO ABS: 0.7 10*3/uL (ref 0.1–1.0)
Monocytes Relative: 11 %
Neutro Abs: 3.1 10*3/uL (ref 1.7–7.7)
Neutrophils Relative %: 46 %
Platelets: 250 10*3/uL (ref 150–400)
RBC: 3.92 MIL/uL (ref 3.87–5.11)
RDW: 15.5 % (ref 11.5–15.5)
WBC: 6.7 10*3/uL (ref 4.0–10.5)
nRBC: 0 % (ref 0.0–0.2)

## 2018-09-23 LAB — URINALYSIS, COMPLETE (UACMP) WITH MICROSCOPIC
Bilirubin Urine: NEGATIVE
Glucose, UA: NEGATIVE mg/dL
Ketones, ur: NEGATIVE mg/dL
Nitrite: NEGATIVE
PH: 6 (ref 5.0–8.0)
Protein, ur: NEGATIVE mg/dL
SPECIFIC GRAVITY, URINE: 1.024 (ref 1.005–1.030)

## 2018-09-23 LAB — POCT PREGNANCY, URINE: Preg Test, Ur: NEGATIVE

## 2018-09-23 MED ORDER — KETOROLAC TROMETHAMINE 30 MG/ML IJ SOLN: 15.0000 mg | Freq: Once | INTRAMUSCULAR | Status: DC

## 2018-09-23 NOTE — ED Notes (Signed)
Ultrasound tech went to get patient to take for imaging. Patient not in room. This Clinical research associate went and looked in room. Patient had removed gown and was no longer in room. Went to entrance of ED and registration seen patient leave. This writer went outside and patient was no where to be seen.

## 2018-09-23 NOTE — ED Provider Notes (Signed)
[facility] Emergency Department Provider Note    First MD Initiated Contact with Patient 09/23/18 0615     (approximate)  I have reviewed the triage vital signs and the nursing notes.   HISTORY  Chief Complaint Vaginal Bleeding    HPI Laurie [name] is a 28 y.o. female presents the ER for evaluation of 2 weeks of vaginal bleeding.  States that she is soaking through 2-3 tampons daily.  Denies any chest pain or shortness of breath.  She is on Depo-Provera shot.  Last menstrual cycle was in December.  Does not smoke.   She denies any nausea or vomiting.  No vaginal discharge.  No fevers.  No flank pain.   Past Medical History:  Diagnosis Date  . Abnormal Pap smear    LAST PAP 09/2010  . Asthma 2010   has not used inhaler "in a while" 04/18/18  . Gonorrhea 07/2013  . Headache(784.0)    FREQUENT  . Seizures (HCC) 2009   WITH MIGRAINE; happened once   Family History  Problem Relation Age of Onset  . Hypertension Father   . Asthma Father   . Asthma Brother   . Arthritis Paternal Aunt    Past Surgical History:  Procedure Laterality Date  . APPENDECTOMY    . LAPAROSCOPIC APPENDECTOMY N/A 07/22/2013   Procedure: APPENDECTOMY LAPAROSCOPIC;  Surgeon: Wilmon Arms. Corliss Skains, MD;  Location: [facility] OR;  Service: General;  Laterality: N/A;  . LAPAROSCOPY N/A 07/22/2013   Procedure: LAPAROSCOPY DIAGNOSTIC;  Surgeon: Wilmon Arms. Corliss Skains, MD;  Location: [facility] OR;  Service: General;  Laterality: N/A;   Patient Active Problem List   Diagnosis Date Noted  . Pregnancy affected by fetal growth restriction 10/25/2014  . IUGR (intrauterine growth restriction) affecting care of mother   . [redacted] weeks gestation of pregnancy   . Gonorrhea 07/22/2013  . RLQ abdominal pain 07/21/2013  . SVD (spontaneous vaginal delivery) 12/16/2012  . H/O fetal biophysical profile 4/8 12/15/2012  . Asthma 07/16/2012      Prior to Admission medications   Medication Sig Start Date End Date  Taking? Authorizing Provider  acetaminophen (TYLENOL) 325 MG tablet Take 2 tablets (650 mg total) by mouth every 4 (four) hours as needed. 08/20/18 08/20/19  Enid Derry, PA-C  benzonatate (TESSALON PERLES) 100 MG capsule Take 1 capsule (100 mg total) by mouth 3 (three) times daily as needed for cough. 08/20/18 08/20/19  Enid Derry, PA-C  Cetirizine HCl 10 MG CAPS Take 1 capsule (10 mg total) by mouth daily. 04/16/18   Wieters, Hallie C, PA-C  ibuprofen (ADVIL,MOTRIN) 600 MG tablet Take 1 tablet (600 mg total) by mouth every 6 (six) hours as needed. 08/20/18   Enid Derry, PA-C  methylPREDNISolone (MEDROL DOSEPAK) 4 MG TBPK tablet Take Tapered dose as directed 09/10/18   Joni Reining, PA-C  promethazine-codeine [facility] WITH CODEINE) 6.25-10 MG/5ML syrup Take 5 mLs by mouth every 6 (six) hours as needed for cough. 09/10/18   Joni Reining, PA-C    Allergies Patient has no known allergies.    Social History Social History   Tobacco Use  . Smoking status: Never Smoker  . Smokeless tobacco: Never Used  Substance Use Topics  . Alcohol use: No    Comment: 07/22/2013 "birthday parties, etc; a few times/yr"LAST   TIME1-2015  . Drug use: No    Review of Systems Patient denies headaches, rhinorrhea, blurry vision, numbness, shortness of breath, chest pain, edema, cough, abdominal pain, nausea, vomiting, diarrhea,  dysuria, fevers, rashes or hallucinations unless otherwise stated above in HPI. ____________________________________________   PHYSICAL EXAM:  VITAL SIGNS: Vitals:   09/23/18 0019 09/23/18 0400  BP: (!) 104/59 (!) 114/45  Pulse: 78 74  Resp: 18 18  Temp: 98.9 F (37.2 C) 97.9 F (36.6 C)  SpO2: 100% 97%    Constitutional: Alert and oriented.  Eyes: Conjunctivae are normal.  Head: Atraumatic. Nose: No congestion/rhinnorhea. Mouth/Throat: Mucous membranes are moist.   Neck: No stridor. Painless ROM.  Cardiovascular: Normal rate, regular rhythm. Grossly normal heart  sounds.  Good peripheral circulation. Respiratory: Normal respiratory effort.  No retractions. Lungs CTAB. Gastrointestinal: Soft and nontender. No distention. No abdominal bruits. No CVA tenderness. Genitourinary: deferred Musculoskeletal: No lower extremity tenderness nor edema.  No joint effusions. Neurologic:  Normal speech and language. No gross focal neurologic deficits are appreciated. No facial droop Skin:  Skin is warm, dry and intact. No rash noted. Psychiatric: Mood and affect are normal. Speech and behavior are normal.  ____________________________________________   LABS (all labs ordered are listed, but only abnormal results are displayed)  Results for orders placed or performed during the hospital encounter of 09/23/18 (from the past 24 hour(s))  CBC with Differential     Status: Abnormal   Collection Time: 09/23/18 12:20 AM  Result Value Ref Range   WBC 6.7 4.0 - 10.5 K/uL   RBC 3.92 3.87 - 5.11 MIL/uL   Hemoglobin 11.3 (L) 12.0 - 15.0 g/dL   HCT 16.1 09.6 - 04.5 %   MCV 93.1 80.0 - 100.0 fL   MCH 28.8 26.0 - 34.0 pg   MCHC 31.0 30.0 - 36.0 g/dL   RDW 40.9 81.1 - 91.4 %   Platelets 250 150 - 400 K/uL   nRBC 0.0 0.0 - 0.2 %   Neutrophils Relative % 46 %   Neutro Abs 3.1 1.7 - 7.7 K/uL   Lymphocytes Relative 39 %   Lymphs Abs 2.6 0.7 - 4.0 K/uL   Monocytes Relative 11 %   Monocytes Absolute 0.7 0.1 - 1.0 K/uL   Eosinophils Relative 3 %   Eosinophils Absolute 0.2 0.0 - 0.5 K/uL   Basophils Relative 1 %   Basophils Absolute 0.0 0.0 - 0.1 K/uL   Immature Granulocytes 0 %   Abs Immature Granulocytes 0.02 0.00 - 0.07 K/uL  Comprehensive metabolic panel     Status: Abnormal   Collection Time: 09/23/18 12:20 AM  Result Value Ref Range   Sodium 139 135 - 145 mmol/L   Potassium 3.4 (L) 3.5 - 5.1 mmol/L   Chloride 108 98 - 111 mmol/L   CO2 24 22 - 32 mmol/L   Glucose, Bld 97 70 - 99 mg/dL   BUN 13 6 - 20 mg/dL   Creatinine, Ser 7.82 0.44 - 1.00 mg/dL   Calcium 9.2  8.9 - 95.6 mg/dL   Total Protein 7.7 6.5 - 8.1 g/dL   Albumin 3.9 3.5 - 5.0 g/dL   AST 24 15 - 41 U/L   ALT 23 0 - 44 U/L   Alkaline Phosphatase 57 38 - 126 U/L   Total Bilirubin 0.4 0.3 - 1.2 mg/dL   GFR calc non Af Amer >60 >60 mL/min   GFR calc Af Amer >60 >60 mL/min   Anion gap 7 5 - 15  Urinalysis, Complete w Microscopic     Status: Abnormal   Collection Time: 09/23/18 12:20 AM  Result Value Ref Range   Color, Urine YELLOW (A) YELLOW  APPearance CLEAR (A) CLEAR   Specific Gravity, Urine 1.024 1.005 - 1.030   pH 6.0 5.0 - 8.0   Glucose, UA NEGATIVE NEGATIVE mg/dL   Hgb urine dipstick SMALL (A) NEGATIVE   Bilirubin Urine NEGATIVE NEGATIVE   Ketones, ur NEGATIVE NEGATIVE mg/dL   Protein, ur NEGATIVE NEGATIVE mg/dL   Nitrite NEGATIVE NEGATIVE   Leukocytes,Ua TRACE (A) NEGATIVE   RBC / HPF 0-5 0 - 5 RBC/hpf   WBC, UA 0-5 0 - 5 WBC/hpf   Bacteria, UA RARE (A) NONE SEEN   Squamous Epithelial / LPF 0-5 0 - 5   Mucus PRESENT   Pregnancy, urine POC     Status: None   Collection Time: 09/23/18 12:28 AM  Result Value Ref Range   Preg Test, Ur NEGATIVE NEGATIVE   ____________________________________________ ____________________________________________  RADIOLOGY  I personally reviewed all radiographic images ordered to evaluate for the above acute complaints and reviewed radiology reports and findings.  These findings were personally discussed with the patient.  Please see medical record for radiology report.  ____________________________________________   PROCEDURES  Procedure(s) performed:  Procedures    Critical Care performed: no ____________________________________________   INITIAL IMPRESSION / ASSESSMENT AND PLAN / ED COURSE  Pertinent labs & imaging results that were available during my care of the patient were reviewed by me and considered in my medical decision making (see chart for details).   DDX: dub, aub, ectopic, fibroids  Khamiya Lehrer  is a 28 y.o. who presents to the ED with symptoms as described above.  Patient nontoxic.  She is afebrile and hemodynamically stable.  No evidence of acute blood loss anemia.  No signs of infectious process.  Ultrasound ordered to evaluate for any evidence of structural or anatomic lesion.  She is already on contraceptive medication.  Will give dose of Toradol.  If ultrasound is negative we will give referral to OB/GYN.  Clinical Course as of Sep 22 644  Thu Sep 23, 2018  0645 Patient is not pregnant.  Blood work otherwise reassuring.  Was informed by nurse that the patient had eloped from the ER.   [PR]    Clinical Course User Index [PR] Willy Eddy, MD     As part of my medical decision making, I reviewed the following data within the electronic MEDICAL RECORD NUMBER Nursing notes reviewed and incorporated, Labs reviewed, notes from prior ED visits.   ____________________________________________   FINAL CLINICAL IMPRESSION(S) / ED DIAGNOSES  Final diagnoses:  Pelvic pain  Vaginal bleeding      NEW MEDICATIONS STARTED DURING THIS VISIT:  New Prescriptions   No medications on file     Note:  This document was prepared using Dragon voice recognition software and may include unintentional dictation errors.    Willy Eddy, MD 09/23/18 [phone]

## 2018-09-23 NOTE — ED Triage Notes (Signed)
Patient ambulatory to triage with steady gait, without difficulty or distress noted, mask in place; pt reports vag bleeding x 2wks with abd cramping

## 2018-09-23 NOTE — ED Notes (Signed)
Pt ambulatory to STAT desk without difficulty or distress noted; pt updated on wait time and warm blanket offerred; vs retaken

## 2018-09-23 NOTE — ED Notes (Signed)
Patient currently on depo shot that was restarted in Jan. Patient has been having cramping and bleeding for 2 weeks. Patient states using 2 tampons a day and also uses pads.

## 2018-10-29 ENCOUNTER — Other Ambulatory Visit (HOSPITAL_COMMUNITY)
Admission: RE | Admit: 2018-10-29 | Discharge: 2018-10-29 | Disposition: A | Discharge status: Home / Self Care | Payer: Medicaid Other | Source: Ambulatory Visit | Attending: Obstetrics and Gynecology | Admitting: Obstetrics and Gynecology

## 2018-10-29 ENCOUNTER — Other Ambulatory Visit: Payer: Self-pay

## 2018-10-29 ENCOUNTER — Encounter: Payer: Self-pay | Admitting: Obstetrics and Gynecology

## 2018-10-29 ENCOUNTER — Ambulatory Visit (INDEPENDENT_AMBULATORY_CARE_PROVIDER_SITE_OTHER): Payer: Medicaid Other | Admitting: Obstetrics and Gynecology

## 2018-10-29 VITALS — BP 108/70 | Ht 65.0 in | Wt 132.0 lb

## 2018-10-29 DIAGNOSIS — Z124 Encounter for screening for malignant neoplasm of cervix: Secondary | ICD-10-CM

## 2018-10-29 DIAGNOSIS — Z30017 Encounter for initial prescription of implantable subdermal contraceptive: Secondary | ICD-10-CM

## 2018-10-29 DIAGNOSIS — Z113 Encounter for screening for infections with a predominantly sexual mode of transmission: Secondary | ICD-10-CM | POA: Diagnosis not present

## 2018-10-29 DIAGNOSIS — A6004 Herpesviral vulvovaginitis: Secondary | ICD-10-CM

## 2018-10-29 MED ORDER — VALACYCLOVIR HCL 1 G PO TABS: 1000.0000 mg | ORAL_TABLET | Freq: Two times a day (BID) | ORAL | 0 refills | Status: AC

## 2018-10-29 MED ORDER — ETONOGESTREL 68 MG ~~LOC~~ IMPL: 1.0000 | DRUG_IMPLANT | Freq: Once | SUBCUTANEOUS | 0 refills | Status: AC

## 2018-10-29 NOTE — Patient Instructions (Signed)
Genital Herpes °Genital herpes is a common sexually transmitted infection (STI) that is caused by a virus. The virus spreads from person to person through sexual contact. Infection can cause itching, blisters, and sores around the genitals or rectum. Symptoms may last several days and then go away This is called an outbreak. However, the virus remains in your body, so you may have more outbreaks in the future. The time between outbreaks varies and can be months or years. °Genital herpes affects men and women. It is particularly concerning for pregnant women because the virus can be passed to the baby during delivery and can cause serious problems. Genital herpes is also a concern for people who have a weak disease-fighting (immune) system. °What are the causes? °This condition is caused by the herpes simplex virus (HSV) type 1 or type 2. The virus may spread through: °· Sexual contact with an infected person, including vaginal, anal, and oral sex. °· Contact with fluid from a herpes sore. °· The skin. This means that you can get herpes from an infected partner even if he or she does not have a visible sore or does not know that he or she is infected. °What increases the risk? °You are more likely to develop this condition if: °· You have sex with many partners. °· You do not use latex condoms during sex. °What are the signs or symptoms? °Most people do not have symptoms (asymptomatic) or have mild symptoms that may be mistaken for other skin problems. Symptoms may include: °· Small red bumps near the genitals, rectum, or mouth. These bumps turn into blisters and then turn into sores. °· Flu-like symptoms, including: °? Fever. °? Body aches. °? Swollen lymph nodes. °? Headache. °· Painful urination. °· Pain and itching in the genital area or rectal area. °· Vaginal discharge. °· Tingling or shooting pain in the legs and buttocks. °Generally, symptoms are more severe and last longer during the first (primary)  outbreak. Flu-like symptoms are also more common during the primary outbreak. °How is this diagnosed? °Genital herpes may be diagnosed based on: °· A physical exam. °· Your medical history. °· Blood tests. °· A test of a fluid sample (culture) from an open sore. °How is this treated? °There is no cure for this condition, but treatment with antiviral medicines that are taken by mouth (orally) can do the following: °· Speed up healing and relieve symptoms. °· Help to reduce the spread of the virus to sexual partners. °· Limit the chance of future outbreaks, or make future outbreaks shorter. °· Lessen symptoms of future outbreaks. °Your health care provider may also recommend pain relief medicines, such as aspirin or ibuprofen. °Follow these instructions at home: °Sexual activity °· Do not have sexual contact during active outbreaks. °· Practice safe sex. Latex condoms and female condoms may help prevent the spread of the herpes virus. °General instructions °· Keep the affected areas dry and clean. °· Take over-the-counter and prescription medicines only as told by your health care provider. °· Avoid rubbing or touching blisters and sores. If you do touch blisters or sores: °? Wash your hands thoroughly with soap and water. °? Do not touch your eyes afterward. °· To help relieve pain or itching, you may take the following actions as directed by your health care provider: °? Apply a cold, wet cloth (cold compress) to affected areas 4-6 times a day. °? Apply a substance that protects your skin and reduces bleeding (astringent). °? Apply a gel that   helps relieve pain around sores (lidocaine gel). °? Take a warm, shallow bath that cleans the genital area (sitz bath). °· Keep all follow-up visits as told by your health care provider. This is important. °How is this prevented? °· Use condoms. Although anyone can get genital herpes during sexual contact, even with the use of a condom, a condom can provide some  protection. °· Avoid having multiple sexual partners. °· Talk with your sexual partner about any symptoms either of you may have. Also, talk with your partner about any history of STIs. °· Get tested for STIs before you have sex. Ask your partner to do the same. °· Do not have sexual contact if you have symptoms of genital herpes. °Contact a health care provider if: °· Your symptoms are not improving with medicine. °· Your symptoms return. °· You have new symptoms. °· You have a fever. °· You have abdominal pain. °· You have redness, swelling, or pain in your eye. °· You notice new sores on other parts of your body. °· You are a woman and experience bleeding between menstrual periods. °· You have had herpes and you become pregnant or plan to become pregnant. °Summary °· Genital herpes is a common sexually transmitted infection (STI) that is caused by the herpes simplex virus (HSV) type 1 or type 2. °· These viruses are most often spread through sexual contact with an infected person. °· You are more likely to develop this condition if you have sex with many partners or you have unprotected sex. °· Most people do not have symptoms (asymptomatic) or have mild symptoms that may be mistaken for other skin problems. Symptoms occur as outbreaks that may happen months or years apart. °· There is no cure for this condition, but treatment with oral antiviral medicines can reduce symptoms, reduce the chance of spreading the virus to a partner, prevent future outbreaks, or shorten future outbreaks. °This information is not intended to replace advice given to you by your health care provider. Make sure you discuss any questions you have with your health care provider. °Document Released: 06/27/2000 Document Revised: 05/30/2016 Document Reviewed: 05/30/2016 °[employer] Interactive Patient Education © 2019 [employer] ° °

## 2018-10-29 NOTE — Progress Notes (Signed)
Obstetrics & Gynecology Office Visit   Chief Complaint  Patient presents with  . Metrorrhagia    bled straight for two weeks last month, had a lot of cramping   . Vaginal Discharge    there is itchiness/irritation, no odor since February 2020    History of Present Illness: 28 y.o. [phone] female who presents for vulvar irritation and burning.  The patient has been taking Depakote for the past 3 months.  Her last dose was in January and she was supposed to receive the new dose on April 14.  She has been having heavier discharge since starting Depakote without any additional symptoms.  She even stopped shaving her pubic area thinking this might improve the discharge.  However, about 4 days ago she began noticing some irritation in her vulva including increased sensitivity and burning.  She then shaved the area and noticed that the skin was peeling a little.  She denies any new odor.  She states she has not been sexually active since November of last year.  She believes her last STD screen was last summer and it was negative.  She has never had this type of sensation before.  She denies a history of a diagnosis of genital herpes.  She tried an over-the-counter yeast infection treatment yesterday that was a single dose treatment.  She does not remember the last time she had a Pap smear.  She does recall having one abnormal one in the past.  She is currently on Depo-Provera injections and desiring to start Nexplanon.  She has a past medical history significant for migraine with aura.  She specifically denies a history of chronic hypertension, history of DVT/PE and smoking.  Reported No LMP recorded. Patient has had an injection..     Past Medical History:  Diagnosis Date  . Abnormal Pap smear    LAST PAP 09/2010  . Asthma 2010   has not used inhaler "in a while" 04/18/18  . Gonorrhea 07/2013  . Headache(784.0)    FREQUENT  . Seizures ([facility]) 2009   WITH MIGRAINE; happened once    Past Surgical  History:  Procedure Laterality Date  . APPENDECTOMY    . LAPAROSCOPIC APPENDECTOMY N/A 07/22/2013   Procedure: APPENDECTOMY LAPAROSCOPIC;  Surgeon: Wilmon Arms. Corliss Skains, MD;  Location: [facility] OR;  Service: General;  Laterality: N/A;  . LAPAROSCOPY N/A 07/22/2013   Procedure: LAPAROSCOPY DIAGNOSTIC;  Surgeon: Wilmon Arms. Corliss Skains, MD;  Location: [facility] OR;  Service: General;  Laterality: N/A;    Gynecologic History: No LMP recorded. Patient has had an injection.  Obstetric History: [id number]  Family History  Problem Relation Age of Onset  . Hypertension Father   . Asthma Father   . Asthma Brother   . Arthritis Paternal Aunt     Social History   Socioeconomic History  . Marital status: Single    Spouse name: Not on file  . Number of children: 2  . Years of education: 24  . Highest education level: Not on file  Occupational History  . Occupation: HOMEMAKER  Social Needs  . Financial resource strain: Not on file  . Food insecurity:    Worry: Not on file    Inability: Not on file  . Transportation needs:    Medical: Not on file    Non-medical: Not on file  Tobacco Use  . Smoking status: Never Smoker  . Smokeless tobacco: Never Used  Substance and Sexual Activity  . Alcohol use: No    Comment: 07/22/2013 "birthday  parties, etc; a few times/yr"LAST   TIME1-2015  . Drug use: No  . Sexual activity: Not Currently    Partners: Male    Birth control/protection: None  Lifestyle  . Physical activity:    Days per week: Not on file    Minutes per session: Not on file  . Stress: Not on file  Relationships  . Social connections:    Talks on phone: Not on file    Gets together: Not on file    Attends religious service: Not on file    Active member of club or organization: Not on file    Attends meetings of clubs or organizations: Not on file    Relationship status: Not on file  . Intimate partner violence:    Fear of current or ex partner: Not on file    Emotionally abused: Not on file     Physically abused: Not on file    Forced sexual activity: Not on file  Other Topics Concern  . Not on file  Social History Narrative  . Not on file   Allergies: No Known Allergies  Prior to Admission medications   denies    Review of Systems  Constitutional: Negative.   HENT: Negative.   Eyes: Negative.   Respiratory: Negative.   Cardiovascular: Negative.   Gastrointestinal: Negative.   Genitourinary: Negative.   Musculoskeletal: Negative.   Skin: Negative.   Neurological: Negative.   Psychiatric/Behavioral: Negative.      Physical Exam BP 108/70   Ht  (1.651 m)   Wt 132 lb (59.9 kg)   Breastfeeding No   BMI 21.97 kg/m  No LMP recorded. Patient has had an injection. Physical Exam Constitutional:      General: She is not in acute distress.    Appearance: Normal appearance. She is well-developed.  Genitourinary:     Pelvic exam was performed with patient supine.     Inguinal canal, urethra, bladder, vagina, uterus, right adnexa and left adnexa normal.     Vulval lesion, tenderness and ulcerations present.     No vulval condylomata, Bartholin's cyst or rash noted.     No signs of labial injury.  No labial fusion.    No posterior fourchette tenderness, injury or lesion present.        No cervical discharge, lesion, bleeding or polyp.  HENT:     Head: Normocephalic and atraumatic.  Eyes:     General: No scleral icterus.    Conjunctiva/sclera: Conjunctivae normal.  Neck:     Musculoskeletal: Normal range of motion and neck supple.  Cardiovascular:     Rate and Rhythm: Normal rate and regular rhythm.     Heart sounds: No murmur. No friction rub. No gallop.   Pulmonary:     Effort: Pulmonary effort is normal. No respiratory distress.     Breath sounds: Normal breath sounds. No wheezing or rales.  Abdominal:     General: Bowel sounds are normal. There is no distension.     Palpations: Abdomen is soft. There is no mass.     Tenderness: There is no abdominal  tenderness. There is no guarding or rebound.  Musculoskeletal: Normal range of motion.  Neurological:     General: No focal deficit present.     Mental Status: She is alert and oriented to person, place, and time.     Cranial Nerves: No cranial nerve deficit.  Skin:    General: Skin is warm and dry.     Findings:  No erythema.  Psychiatric:        Mood and Affect: Mood normal.        Behavior: Behavior normal.        Judgment: Judgment normal.     GYNECOLOGY PROCEDURE NOTE  Patient is a 28 y.o. [id number] presenting for Nexplanon insertion as her desires means of contraception.  She provided informed consent, signed copy in the chart, time out was performed. Pregnancy test was negative, with self reported LMP of No LMP recorded. Patient has had an injection.  She understands that Nexplanon is a progesterone only therapy, and that patients often patients have irregular and unpredictable vaginal bleeding or amenorrhea. She understands that other side effects are possible related to systemic progesterone, including but not limited to, headaches, breast tenderness, nausea, and irritability. While effective at preventing pregnancy long acting reversible contraceptives do not prevent transmission of sexually transmitted diseases and use of barrier methods for this purpose was discussed. The placement procedure for Nexplanon was reviewed with the patient in detail including risks of nerve injury, infection, bleeding and injury to other muscles or tendons. She understands that the Nexplanon implant is good for 3 years and needs to be removed at the end of that time.  She understands that Nexplanon is an extremely effective option for contraception, with failure rate of <1%. This information is reviewed today and all questions were answered. Informed consent was obtained, both verbally and written.   The patient is healthy and has no contraindications to Nexplanon use. Urine pregnancy test was performed  today and was negative.  Procedure Appropriate time out taken.  Patient placed in dorsal supine with left arm above head, elbow flexed at 90 degrees, arm resting on examination table with hand behind her head.  The bicipital grove was palpated and site 8-10cm proximal to the medial epicondyle was indentified.  Per the manufacturer's recommendations, the insertion site was marked along a line 3-5 cm posterior (toward the triceps) to the bicipital groove and at 8-10 cm medial to the medial epicondyle. The insertion site was prepped with a two betadine swabs and then injected with 2 cc of 1% lidocaine without epinephrine.  Nexplanon removed form sterile blister packaging,  Device confirmed in needle, before inserting full length of needle, tenting up the skin as the needle was advance.  The drug eluting rod was then deployed by pulling back the slider per the manufactures recommendation.  The implant was palpable by the clinician as well as the patient.  The insertion site covered dressed with a 1/2" steri-strip before applying  a kerlex bandage pressure dressing..Minimal blood loss was noted during the procedure.  The patient tolerated the procedure well.   She was instructed to wear the bandage for 24 hours, call with any signs of infection.  She was given the Nexplanon card and instructed to have the rod removed in 3 years.  Female chaperone present for pelvic and breast  portions of the physical exam  Wet Prep: PH: not done Clue Cells: Negative Fungal elements: Negative Trichomonas: Negative   Assessment: 28 y.o. [id number] female here for  1. Herpes, vulvovaginitis   2. Screen for STD (sexually transmitted disease)   3. Pap smear for cervical cancer screening   4. Insertion of Nexplanon      Plan: Problem List Items Addressed This Visit    None    Visit Diagnoses    Herpes, vulvovaginitis    -  Primary   Relevant Medications   valACYclovir (VALTREX)  1000 MG tablet   Other Relevant  Orders   Herpes simplex virus culture   Screen for STD (sexually transmitted disease)       Relevant Orders   Cervicovaginal ancillary only   Pap smear for cervical cancer screening       Relevant Orders   Cytology - PAP   Insertion of Nexplanon       Relevant Medications   etonogestrel (NEXPLANON) 68 MG IMPL implant     The clinical history and exam are consistent with an initial outbreak of genital herpes.  Will send culture for confirmation of herpes.  However, we will go ahead and begin treatment given the patient's discomfort.  We will also test for STDs and perform a Pap smear, as she is unsure of how long it has been since her last.  Discussed genital herpes has a diagnosis and that herpes is not a curable viral infection.  Recommend she learn as much as possible and I am happy to make additional resources available to her regarding this diagnosis.  Thomasene MohairStephen Jaimya Feliciano, MD 10/29/2018 1:39 PM

## 2018-11-02 LAB — CYTOLOGY - PAP: Diagnosis: NEGATIVE

## 2018-11-02 LAB — CERVICOVAGINAL ANCILLARY ONLY
Chlamydia: NEGATIVE
Neisseria Gonorrhea: NEGATIVE
Trichomonas: NEGATIVE

## 2018-11-02 LAB — HERPES SIMPLEX VIRUS CULTURE

## 2019-03-28 ENCOUNTER — Ambulatory Visit (INDEPENDENT_AMBULATORY_CARE_PROVIDER_SITE_OTHER): Payer: Medicaid Other | Admitting: Obstetrics and Gynecology

## 2019-03-28 ENCOUNTER — Encounter: Payer: Self-pay | Admitting: Obstetrics and Gynecology

## 2019-03-28 ENCOUNTER — Telehealth: Payer: Self-pay

## 2019-03-28 ENCOUNTER — Other Ambulatory Visit: Payer: Self-pay

## 2019-03-28 VITALS — BP 122/74 | Ht 65.0 in | Wt 133.0 lb

## 2019-03-28 DIAGNOSIS — Z3046 Encounter for surveillance of implantable subdermal contraceptive: Secondary | ICD-10-CM

## 2019-03-28 DIAGNOSIS — Z3049 Encounter for surveillance of other contraceptives: Secondary | ICD-10-CM

## 2019-03-28 DIAGNOSIS — Z30011 Encounter for initial prescription of contraceptive pills: Secondary | ICD-10-CM

## 2019-03-28 MED ORDER — NORGESTIMATE-ETH ESTRADIOL 0.25-35 MG-MCG PO TABS: 1.0000 | ORAL_TABLET | Freq: Every day | ORAL | 4 refills | Status: AC

## 2019-03-28 NOTE — Progress Notes (Signed)
  GYNECOLOGY PROCEDURE NOTE  Nexplanon removal discussed in detail.  Risks of infection, bleeding, nerve injury all reviewed.  Patient understands risks and desires to proceed.  Verbal consent obtained.  Patient is certain she wants the Nexplanon removed.  All questions answered.  Procedure: Patient placed in dorsal supine with left arm above head, elbow flexed at 90 degrees, arm resting on examination table.  Nexplanon identified without problems.  Betadine scrub x3.  1 ml of 1% lidocaine injected under Nexplanondevice without problems.  Sterile gloves applied.  Small 0.5cm incision made at distal tip of Nexplanon device with 11 blade scalpel.  Nexplanon brought to incision and grasped with a small kelly clamp.  Nexplanon removed intact without problems.  Pressure applied to incision.  Hemostasis obtained.  Steri-strips applied, followed by bandage and compression dressing.  Patient tolerated procedure well.  No complications.   Assessment: 28 y.o. year old female now s/p uncomplicated Nexplanon removal.  Plan: 1.  Patient given post procedure precautions and asked to call for fever, chills, redness or drainage from her incision, bleeding from incision.  She understands she will likely have a small bruise near site of removal and can remove bandage tomorrow and steri-strips in approximately 1 week.  2) Contraception: Combined oral contraceptive pills. She has no contraindications to estrogen. We discussed the increased risk of VTE with combined OCPs. She voiced understanding and agreement.   Prentice Docker, MD, [facility], [facility] 03/28/2019 9:36 AM

## 2019-03-28 NOTE — Telephone Encounter (Signed)
Pt calling; had implant removed; having a lot of cramping; wants rx for cramping.  [phone]

## 2019-03-29 NOTE — Telephone Encounter (Signed)
Lm with pt to call back and advised her to take ibuprofen for cramping

## 2019-03-29 NOTE — Telephone Encounter (Signed)
Uterine cramping? Can she just take ibuprofen? That's what I would recommend for that.

## 2019-09-25 IMAGING — CR DG CHEST 2V
1 series · 2 of 2 positions shown · non-contrast
Comparison: None.

CLINICAL DATA: Cough

EXAM:
CHEST - 2 VIEW

[Series 1: dg chest 2 view · 0.14mm/px · 2 of 2 slices shown]
[im 1/2]
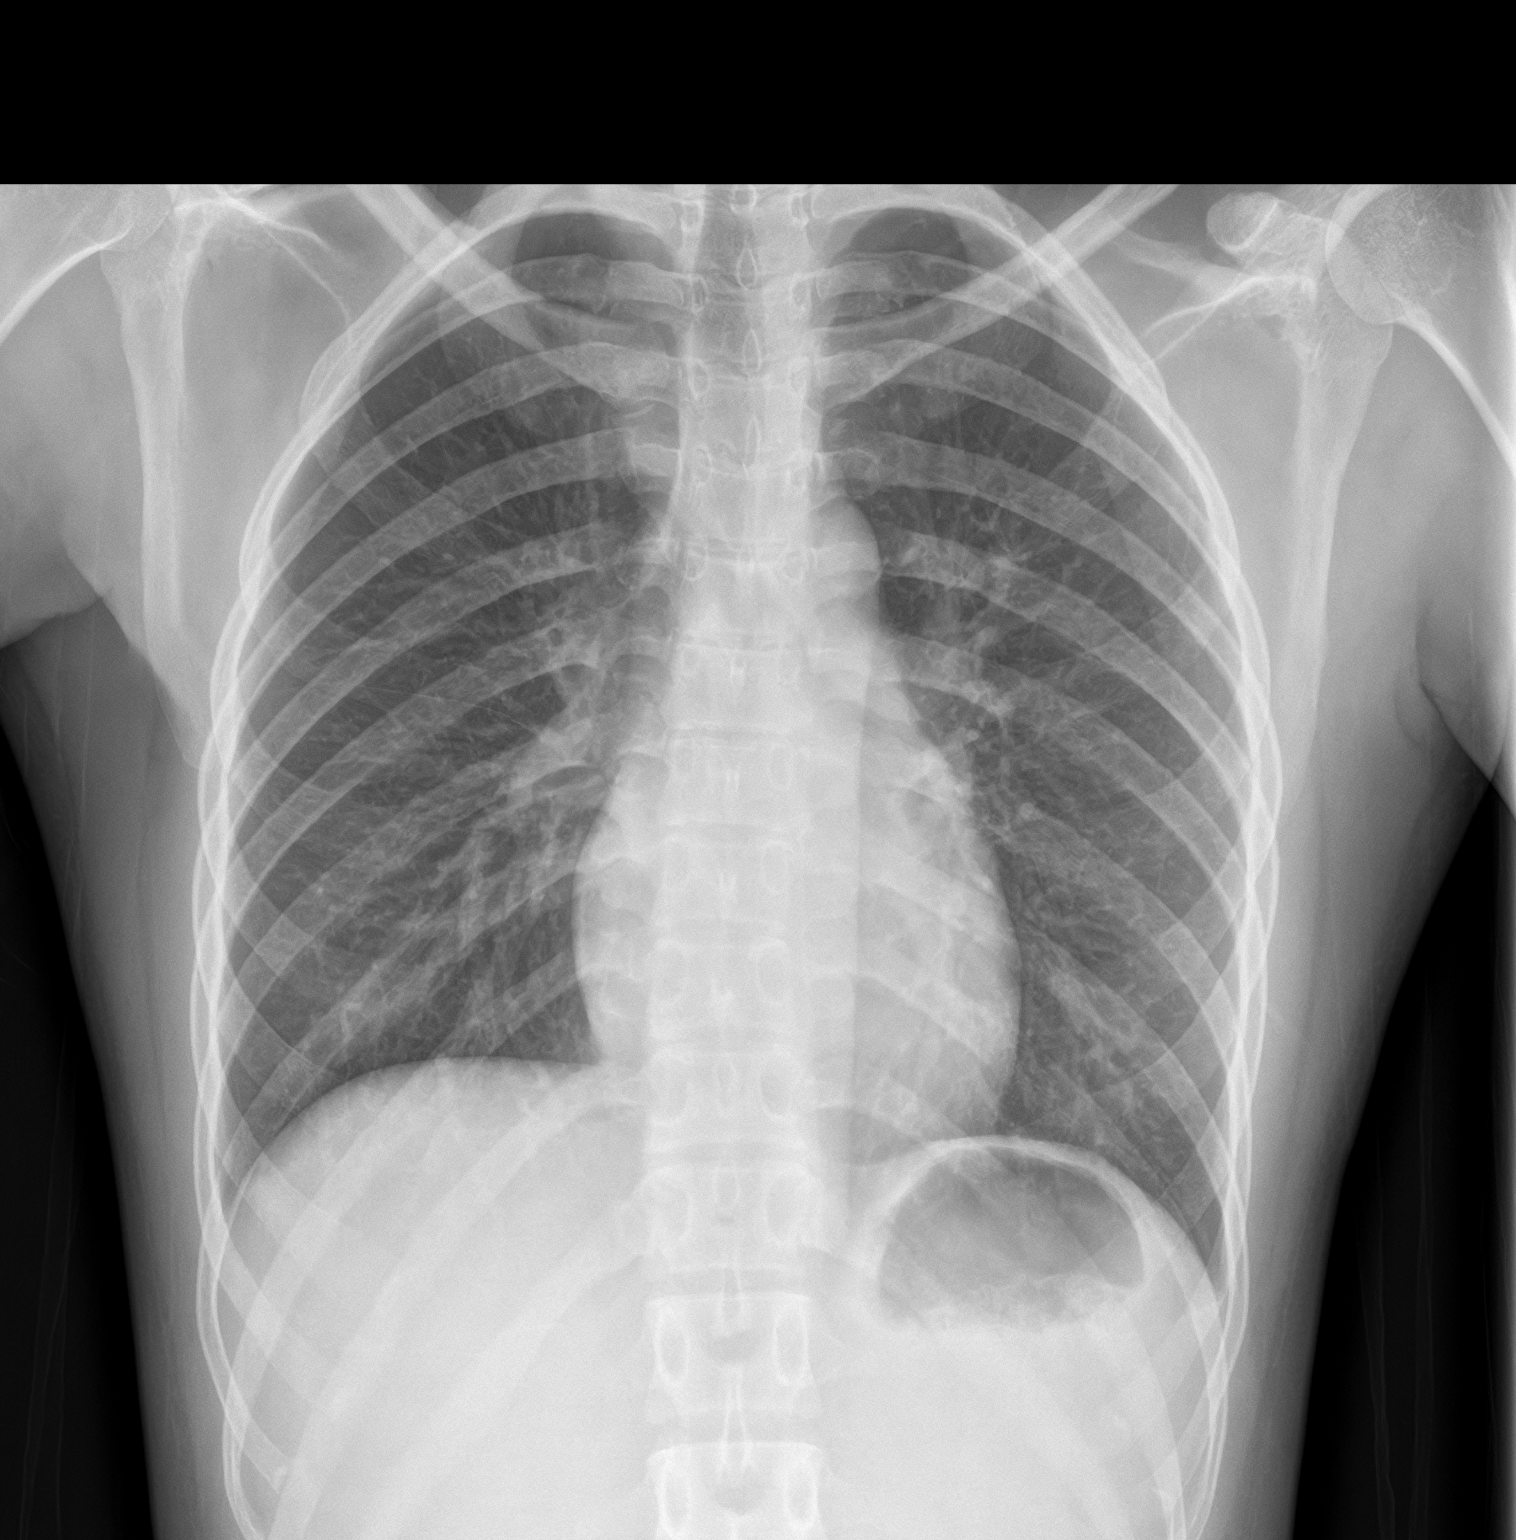
[im 2/2]
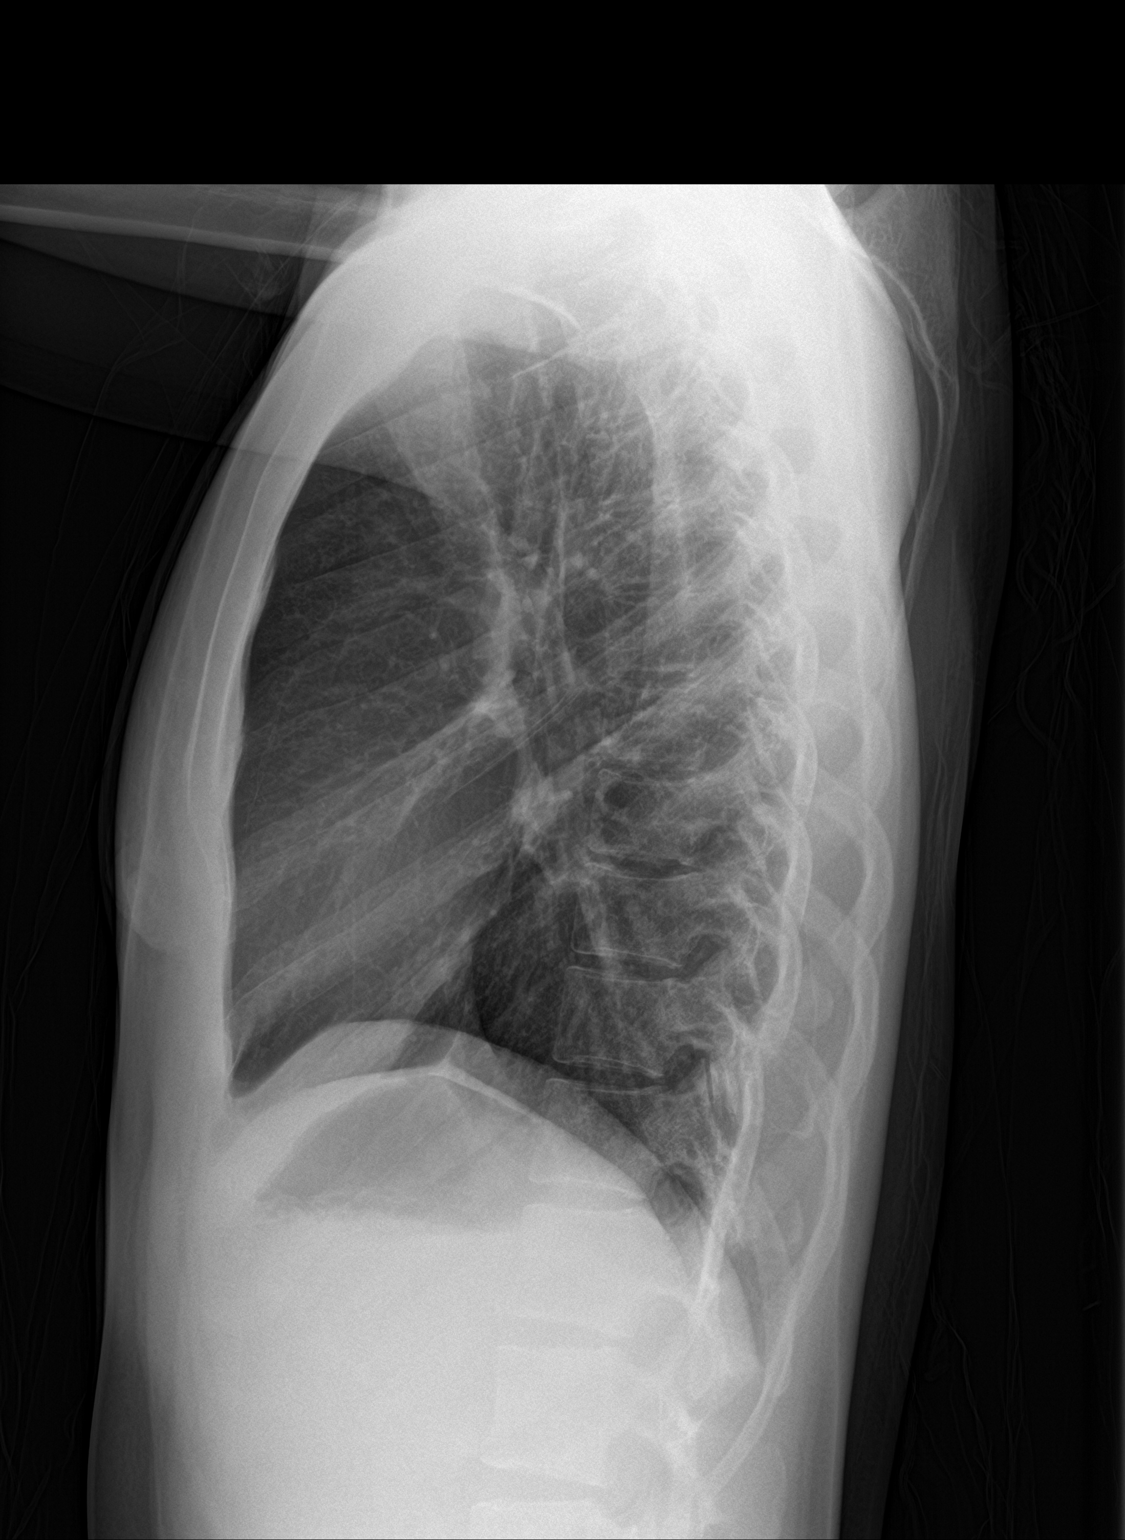

[2 of 2 positions shown; findings below may reference images not displayed]

FINDINGS: Lungs are clear. Heart size and pulmonary vascularity are normal. No
adenopathy. No bone lesions.
IMPRESSION: No edema or consolidation.
